# Patient Record
Sex: Female | Born: 1960 | Race: White | Hispanic: No | Marital: Married | Smoking: Former smoker
Health system: Southern US, Community
[De-identification: ages and names within clinical notes are randomized; demographics above are authoritative.]

## PROBLEM LIST (undated history)

## (undated) DIAGNOSIS — M810 Age-related osteoporosis without current pathological fracture: Secondary | ICD-10-CM

## (undated) DIAGNOSIS — F329 Major depressive disorder, single episode, unspecified: Secondary | ICD-10-CM

## (undated) DIAGNOSIS — M7071 Other bursitis of hip, right hip: Secondary | ICD-10-CM

## (undated) DIAGNOSIS — I10 Essential (primary) hypertension: Secondary | ICD-10-CM

## (undated) DIAGNOSIS — E785 Hyperlipidemia, unspecified: Secondary | ICD-10-CM

## (undated) DIAGNOSIS — R112 Nausea with vomiting, unspecified: Secondary | ICD-10-CM

## (undated) DIAGNOSIS — K219 Gastro-esophageal reflux disease without esophagitis: Secondary | ICD-10-CM

## (undated) DIAGNOSIS — R002 Palpitations: Secondary | ICD-10-CM

## (undated) DIAGNOSIS — E669 Obesity, unspecified: Secondary | ICD-10-CM

## (undated) DIAGNOSIS — H3530 Unspecified macular degeneration: Secondary | ICD-10-CM

## (undated) DIAGNOSIS — Z9889 Other specified postprocedural states: Secondary | ICD-10-CM

## (undated) DIAGNOSIS — F32A Depression, unspecified: Secondary | ICD-10-CM

## (undated) DIAGNOSIS — Z87898 Personal history of other specified conditions: Secondary | ICD-10-CM

## (undated) DIAGNOSIS — M7072 Other bursitis of hip, left hip: Secondary | ICD-10-CM

## (undated) DIAGNOSIS — R32 Unspecified urinary incontinence: Secondary | ICD-10-CM

## (undated) DIAGNOSIS — I498 Other specified cardiac arrhythmias: Secondary | ICD-10-CM

## (undated) DIAGNOSIS — G4730 Sleep apnea, unspecified: Secondary | ICD-10-CM

## (undated) DIAGNOSIS — L723 Sebaceous cyst: Secondary | ICD-10-CM

## (undated) DIAGNOSIS — F419 Anxiety disorder, unspecified: Secondary | ICD-10-CM

## (undated) DIAGNOSIS — K635 Polyp of colon: Secondary | ICD-10-CM

## (undated) DIAGNOSIS — R809 Proteinuria, unspecified: Secondary | ICD-10-CM

## (undated) HISTORY — DX: Proteinuria, unspecified: R80.9

## (undated) HISTORY — DX: Other bursitis of hip, right hip: M70.71

## (undated) HISTORY — DX: Obesity, unspecified: E66.9

## (undated) HISTORY — DX: Hyperlipidemia, unspecified: E78.5

## (undated) HISTORY — PX: COLONOSCOPY: SHX174

## (undated) HISTORY — DX: Polyp of colon: K63.5

## (undated) HISTORY — DX: Palpitations: R00.2

## (undated) HISTORY — DX: Unspecified urinary incontinence: R32

## (undated) HISTORY — DX: Age-related osteoporosis without current pathological fracture: M81.0

## (undated) HISTORY — DX: Other bursitis of hip, right hip: M70.72

## (undated) HISTORY — DX: Other specified cardiac arrhythmias: I49.8

## (undated) HISTORY — PX: UPPER GI ENDOSCOPY: SHX6162

## (undated) HISTORY — DX: Unspecified macular degeneration: H35.30

## (undated) HISTORY — PX: WRIST SURGERY: SHX841

---

## 2013-06-04 ENCOUNTER — Emergency Department (HOSPITAL_COMMUNITY)
Admission: EM | Admit: 2013-06-04 | Discharge: 2013-06-04 | Disposition: A | Discharge status: Home / Self Care | Payer: BC Managed Care – PPO | Attending: Emergency Medicine | Admitting: Emergency Medicine

## 2013-06-04 ENCOUNTER — Encounter (HOSPITAL_COMMUNITY): Payer: Self-pay | Admitting: Emergency Medicine

## 2013-06-04 ENCOUNTER — Emergency Department (HOSPITAL_COMMUNITY): Payer: BC Managed Care – PPO

## 2013-06-04 DIAGNOSIS — R109 Unspecified abdominal pain: Secondary | ICD-10-CM

## 2013-06-04 DIAGNOSIS — Z79899 Other long term (current) drug therapy: Secondary | ICD-10-CM | POA: Insufficient documentation

## 2013-06-04 DIAGNOSIS — R1013 Epigastric pain: Secondary | ICD-10-CM | POA: Insufficient documentation

## 2013-06-04 DIAGNOSIS — Z87891 Personal history of nicotine dependence: Secondary | ICD-10-CM | POA: Insufficient documentation

## 2013-06-04 LAB — CBC WITH DIFFERENTIAL/PLATELET
BASOS ABS: 0 10*3/uL (ref 0.0–0.1)
BASOS PCT: 0 % (ref 0–1)
EOS ABS: 0.1 10*3/uL (ref 0.0–0.7)
Eosinophils Relative: 1 % (ref 0–5)
HCT: 41.5 % (ref 36.0–46.0)
Hemoglobin: 14.3 g/dL (ref 12.0–15.0)
Lymphocytes Relative: 12 % (ref 12–46)
Lymphs Abs: 1.1 10*3/uL (ref 0.7–4.0)
MCH: 29.9 pg (ref 26.0–34.0)
MCHC: 34.5 g/dL (ref 30.0–36.0)
MCV: 86.8 fL (ref 78.0–100.0)
MONOS PCT: 5 % (ref 3–12)
Monocytes Absolute: 0.4 10*3/uL (ref 0.1–1.0)
NEUTROS ABS: 7.2 10*3/uL (ref 1.7–7.7)
Neutrophils Relative %: 81 % — ABNORMAL HIGH (ref 43–77)
PLATELETS: 264 10*3/uL (ref 150–400)
RBC: 4.78 MIL/uL (ref 3.87–5.11)
RDW: 13.3 % (ref 11.5–15.5)
WBC: 8.8 10*3/uL (ref 4.0–10.5)

## 2013-06-04 LAB — URINALYSIS, ROUTINE W REFLEX MICROSCOPIC
BILIRUBIN URINE: NEGATIVE
GLUCOSE, UA: NEGATIVE mg/dL
Hgb urine dipstick: NEGATIVE
Ketones, ur: 40 mg/dL — AB
NITRITE: NEGATIVE
PH: 7.5 (ref 5.0–8.0)
Protein, ur: NEGATIVE mg/dL
Specific Gravity, Urine: 1.012 (ref 1.005–1.030)
Urobilinogen, UA: 0.2 mg/dL (ref 0.0–1.0)

## 2013-06-04 LAB — COMPREHENSIVE METABOLIC PANEL
ALBUMIN: 4.4 g/dL (ref 3.5–5.2)
ALK PHOS: 77 U/L (ref 39–117)
ALT: 20 U/L (ref 0–35)
AST: 20 U/L (ref 0–37)
BUN: 17 mg/dL (ref 6–23)
CHLORIDE: 99 meq/L (ref 96–112)
CO2: 21 mEq/L (ref 19–32)
Calcium: 10 mg/dL (ref 8.4–10.5)
Creatinine, Ser: 0.67 mg/dL (ref 0.50–1.10)
GFR calc Af Amer: >90 mL/min (ref >90)
GFR calc non Af Amer: >90 mL/min (ref >90)
Glucose, Bld: 148 mg/dL — ABNORMAL HIGH (ref 70–99)
POTASSIUM: 3.9 meq/L (ref 3.7–5.3)
SODIUM: 137 meq/L (ref 137–147)
TOTAL PROTEIN: 7.6 g/dL (ref 6.0–8.3)
Total Bilirubin: 0.5 mg/dL (ref 0.3–1.2)

## 2013-06-04 LAB — LIPASE, BLOOD: LIPASE: 26 U/L (ref 11–59)

## 2013-06-04 LAB — URINE MICROSCOPIC-ADD ON

## 2013-06-04 MED ORDER — ONDANSETRON HCL 4 MG/2ML IJ SOLN
4.0000 mg | Freq: Once | INTRAMUSCULAR | Status: AC
  Administered 2013-06-04: 4 mg via INTRAVENOUS
  Filled 2013-06-04: qty 2

## 2013-06-04 MED ORDER — HYDROMORPHONE HCL PF 1 MG/ML IJ SOLN
1.0000 mg | Freq: Once | INTRAMUSCULAR | Status: AC
  Administered 2013-06-04: 1 mg via INTRAVENOUS
  Filled 2013-06-04: qty 1

## 2013-06-04 MED ORDER — SODIUM CHLORIDE 0.9 % IV SOLN
INTRAVENOUS | Status: DC
  Administered 2013-06-04: 13:00:00 via INTRAVENOUS

## 2013-06-04 MED ORDER — DICYCLOMINE HCL 20 MG PO TABS: 20.0000 mg | ORAL_TABLET | Freq: Two times a day (BID) | ORAL | Status: DC

## 2013-06-04 MED ORDER — IOHEXOL 300 MG/ML  SOLN
100.0000 mL | Freq: Once | INTRAMUSCULAR | Status: AC | PRN
  Administered 2013-06-04: 100 mL via INTRAVENOUS

## 2013-06-04 MED ORDER — IOHEXOL 300 MG/ML  SOLN
50.0000 mL | Freq: Once | INTRAMUSCULAR | Status: AC | PRN
  Administered 2013-06-04: 50 mL via ORAL

## 2013-06-04 MED ORDER — SODIUM CHLORIDE 0.9 % IV BOLUS (SEPSIS)
1000.0000 mL | Freq: Once | INTRAVENOUS | Status: AC
  Administered 2013-06-04: 1000 mL via INTRAVENOUS

## 2013-06-04 NOTE — ED Notes (Signed)
Pt states abdominal pain starting 1 hour ago.  Mid upper abdomen.  Pt states no fever/diarrhea or fever.

## 2013-06-04 NOTE — ED Notes (Signed)
Bed: WA06 Expected date:  Expected time:  Means of arrival:  Comments: 

## 2013-06-04 NOTE — ED Notes (Signed)
DRINKING ORAL CONTRAST 

## 2013-06-04 NOTE — ED Provider Notes (Signed)
CSN: [id number]     Arrival date & time 06/04/13  7829 History   First MD Initiated Contact with Patient 06/04/13 1012     Chief Complaint  Patient presents with  . Abdominal Pain     (Consider location/radiation/quality/duration/timing/severity/associated sxs/prior Treatment) Patient is a 53 y.o. female presenting with abdominal pain. The history is provided by the patient and the spouse.  Abdominal Pain  patient here complaining of acute onset of epigastric pain radiating to her back which began just prior to arrival. No fever or chills. No vomiting or diarrhea. Symptoms are persistent and nothing makes them better worse. No treatment used prior to arrival. No prior history of same. Denies any history of cholelithiasis. Denies any urinary symptoms. No dyspnea or anginal type chest pain.  History reviewed. No pertinent past medical history. Past Surgical History  Procedure Laterality Date  . Wrist surgery     History reviewed. No pertinent family history. History  Substance Use Topics  . Smoking status: Former Games developer  . Smokeless tobacco: Not on file  . Alcohol Use: No   OB History   Grav Para Term Preterm Abortions TAB SAB Ect Mult Living                 Review of Systems  Gastrointestinal: Positive for abdominal pain.  All other systems reviewed and are negative.      Allergies  Sulfa antibiotics  Home Medications   Current Outpatient Rx  Name  Route  Sig  Dispense  Refill  . escitalopram (LEXAPRO) 10 MG tablet   Oral   Take 10 mg by mouth daily.         . phentermine (ADIPEX-P) 37.5 MG tablet   Oral   Take 37.5 mg by mouth daily before breakfast.         . rosuvastatin (CRESTOR) 20 MG tablet   Oral   Take 20 mg by mouth daily.          BP 145/74  Pulse 104  Temp(Src) 97.5 F (36.4 C) (Oral)  Resp 18  SpO2 99% Physical Exam  Nursing note and vitals reviewed. Constitutional: She is oriented to person, place, and time. She appears  well-developed and well-nourished.  Non-toxic appearance. No distress.  HENT:  Head: Normocephalic and atraumatic.  Eyes: Conjunctivae, EOM and lids are normal. Pupils are equal, round, and reactive to light.  Neck: Normal range of motion. Neck supple. No tracheal deviation present. No mass present.  Cardiovascular: Normal rate, regular rhythm and normal heart sounds.  Exam reveals no gallop.   No murmur heard. Pulmonary/Chest: Effort normal and breath sounds normal. No stridor. No respiratory distress. She has no decreased breath sounds. She has no wheezes. She has no rhonchi. She has no rales.  Abdominal: Soft. Normal appearance and bowel sounds are normal. She exhibits no distension. There is tenderness in the epigastric area. There is guarding. There is no rigidity, no rebound and no CVA tenderness.    Musculoskeletal: Normal range of motion. She exhibits no edema and no tenderness.  Neurological: She is alert and oriented to person, place, and time. She has normal strength. No cranial nerve deficit or sensory deficit. GCS eye subscore is 4. GCS verbal subscore is 5. GCS motor subscore is 6.  Skin: Skin is warm and dry. No abrasion and no rash noted.  Psychiatric: She has a normal mood and affect. Her speech is normal and behavior is normal.    ED Course  Procedures (including critical  care time) Labs Review Labs Reviewed  CBC WITH DIFFERENTIAL - Abnormal; Notable for the following:    Neutrophils Relative % 81 (*)    All other components within normal limits  COMPREHENSIVE METABOLIC PANEL - Abnormal; Notable for the following:    Glucose, Bld 148 (*)    All other components within normal limits  URINALYSIS, ROUTINE W REFLEX MICROSCOPIC - Abnormal; Notable for the following:    Ketones, ur 40 (*)    Leukocytes, UA TRACE (*)    All other components within normal limits  LIPASE, BLOOD  URINE MICROSCOPIC-ADD ON   Imaging Review [place] Abdomen Complete  06/04/2013   CLINICAL DATA:   Pain.  EXAM: ULTRASOUND ABDOMEN COMPLETE  COMPARISON:  DG ABD ACUTE W/CHEST dated 06/04/2013; NM HEPATO W/GB/PHARM/QUAN MEASUR dated 04/21/2011  FINDINGS: Gallbladder:  No gallstones or wall thickening visualized. No sonographic Murphy sign noted.  Common bile duct:  Diameter: 4.8 mm.  Liver:  No focal lesion identified. Within normal limits in parenchymal echogenicity.  IVC:  No abnormality visualized.  Pancreas:  Pancreatic duct is minimally prominent at 2.5 mm. No obstructing abnormalities identified. No pancreatic mass noted.  Spleen:  Size and appearance within normal limits.  Right Kidney:  Length: 10.1 cm. Echogenicity within normal limits. No mass or hydronephrosis visualized.  Left Kidney:  Length: 11.2 cm. Echogenicity within normal limits. No mass or hydronephrosis visualized.  Abdominal aorta:  No aneurysm visualized.  Other findings:  None.  IMPRESSION: Mild prominence of the pancreatic duct. No evidence of pancreatic mass lesion noted. If clinical concern remains MRI abdomen/ MRCP can be obtained. Exam is otherwise unremarkable.   Electronically Signed   By: Maisie Fushomas  Register   On: 06/04/2013 12:15   Ct Abdomen Pelvis W Contrast  06/04/2013   CLINICAL DATA:  Mid and upper abdominal pain.  EXAM: CT ABDOMEN AND PELVIS WITH CONTRAST  TECHNIQUE: Multidetector CT imaging of the abdomen and pelvis was performed using the standard protocol following bolus administration of intravenous contrast.  CONTRAST:  50 mL OMNIPAQUE IOHEXOL 300 MG/ML SOLN, 100 mL OMNIPAQUE IOHEXOL 300 MG/ML SOLN  COMPARISON:  Chest in two views abdomen 06/04/2013.  FINDINGS: There is some dependent atelectasis in the lung bases. No pleural or pericardial effusion.  A few tiny hypo attenuating lesions in the liver cannot be definitively characterized but likely represent cysts. The liver is otherwise unremarkable. The spleen, adrenal glands, gallbladder, pancreas and kidneys appear normal. A small fat containing umbilical hernia is  identified. Uterus, adnexa and urinary bladder are unremarkable. Prominent stool and gas are seen in the ascending colon. The colon is otherwise unremarkable. The stomach, small bowel and appendix appear normal. There is no lymphadenopathy or fluid. No focal bony abnormality.  IMPRESSION: No acute finding or focal abnormality.  Prominent stool ascending colon is noted.   Electronically Signed   By: Drusilla Kannerhomas  Dalessio M.D.   On: 06/04/2013 15:02   Dg Abd Acute W/chest  06/04/2013   CLINICAL DATA:  Severe abdominal pain.  EXAM: ACUTE ABDOMEN SERIES (ABDOMEN 2 VIEW & CHEST 1 VIEW)  COMPARISON:  None.  FINDINGS: Single view of the chest demonstrates clear lungs and normal heart size. No pneumothorax or pleural effusion.  Two views of the abdomen show no free intraperitoneal air. There is a large volume of gas and stool in the ascending colon. No evidence of bowel obstruction is identified. No abnormal abdominal calcification or focal bony abnormality is identified.  IMPRESSION: No acute finding.  Large volume of  gas and stool ascending colon.   Electronically Signed   By: Drusilla Kanner M.D.   On: 06/04/2013 10:44    EKG Interpretation    Date/Time:  Tuesday June 04 2013 11:20:20 EST Ventricular Rate:  78 PR Interval:  146 QRS Duration: 76 QT Interval:  400 QTC Calculation: 456 R Axis:   81 Text Interpretation:  Sinus rhythm Ventricular premature complex Confirmed by Monish Haliburton  MD, Yaritza Leist ([id number]) on 06/04/2013 3:27:15 PM            MDM   Final diagnoses:  None    patient given pain medication as well as anti-medics here and feels better. Work appears been reassuring. Does show up on constipation. Patient's abdomen examined,and at  discharge and remains nonsurgical.     Toy Baker, MD 06/04/13 [phone]

## 2013-06-04 NOTE — Discharge Instructions (Signed)
Abdominal Pain, Adult °Many things can cause abdominal pain. Usually, abdominal pain is not caused by a disease and will improve without treatment. It can often be observed and treated at home. Your health care provider will do a physical exam and possibly order blood tests and X-rays to help determine the seriousness of your pain. However, in many cases, more time must pass before a clear cause of the pain can be found. Before that point, your health care provider may not know if you need more testing or further treatment. °HOME CARE INSTRUCTIONS  °Monitor your abdominal pain for any changes. The following actions may help to alleviate any discomfort you are experiencing: °· Only take over-the-counter or prescription medicines as directed by your health care provider. °· Do not take laxatives unless directed to do so by your health care provider. °· Try a clear liquid diet (broth, tea, or water) as directed by your health care provider. Slowly move to a bland diet as tolerated. °SEEK MEDICAL CARE IF: °· You have unexplained abdominal pain. °· You have abdominal pain associated with nausea or diarrhea. °· You have pain when you urinate or have a bowel movement. °· You experience abdominal pain that wakes you in the night. °· You have abdominal pain that is worsened or improved by eating food. °· You have abdominal pain that is worsened with eating fatty foods. °SEEK IMMEDIATE MEDICAL CARE IF:  °· Your pain does not go away within 2 hours. °· You have a fever. °· You keep throwing up (vomiting). °· Your pain is felt only in portions of the abdomen, such as the right side or the left lower portion of the abdomen. °· You pass bloody or black tarry stools. °MAKE SURE YOU: °· Understand these instructions.   °· Will watch your condition.   °· Will get help right away if you are not doing well or get worse.   °Document Released: 01/05/2005 Document Revised: 01/16/2013 Document Reviewed: 12/05/2012 °[employer]® Patient  Information ©2014 [employer]. ° °

## 2013-11-26 ENCOUNTER — Other Ambulatory Visit: Payer: Self-pay

## 2013-11-26 DIAGNOSIS — Z1231 Encounter for screening mammogram for malignant neoplasm of breast: Secondary | ICD-10-CM

## 2013-12-05 ENCOUNTER — Encounter (INDEPENDENT_AMBULATORY_CARE_PROVIDER_SITE_OTHER): Payer: Self-pay

## 2013-12-05 ENCOUNTER — Ambulatory Visit
Admission: RE | Admit: 2013-12-05 | Discharge: 2013-12-05 | Disposition: A | Discharge status: Home / Self Care | Payer: BC Managed Care – PPO | Source: Ambulatory Visit

## 2013-12-05 DIAGNOSIS — Z1231 Encounter for screening mammogram for malignant neoplasm of breast: Secondary | ICD-10-CM

## 2014-11-04 ENCOUNTER — Other Ambulatory Visit: Payer: Self-pay

## 2014-11-04 DIAGNOSIS — Z1231 Encounter for screening mammogram for malignant neoplasm of breast: Secondary | ICD-10-CM

## 2014-12-10 ENCOUNTER — Ambulatory Visit
Admission: RE | Admit: 2014-12-10 | Discharge: 2014-12-10 | Disposition: A | Discharge status: Home / Self Care | Payer: 59 | Source: Ambulatory Visit

## 2014-12-10 DIAGNOSIS — Z1231 Encounter for screening mammogram for malignant neoplasm of breast: Secondary | ICD-10-CM

## 2015-07-13 DIAGNOSIS — M7062 Trochanteric bursitis, left hip: Secondary | ICD-10-CM | POA: Diagnosis not present

## 2015-07-15 DIAGNOSIS — M7062 Trochanteric bursitis, left hip: Secondary | ICD-10-CM | POA: Diagnosis not present

## 2015-07-17 DIAGNOSIS — M7062 Trochanteric bursitis, left hip: Secondary | ICD-10-CM | POA: Diagnosis not present

## 2015-07-20 DIAGNOSIS — M7062 Trochanteric bursitis, left hip: Secondary | ICD-10-CM | POA: Diagnosis not present

## 2015-07-22 DIAGNOSIS — M7062 Trochanteric bursitis, left hip: Secondary | ICD-10-CM | POA: Diagnosis not present

## 2015-08-27 DIAGNOSIS — M76891 Other specified enthesopathies of right lower limb, excluding foot: Secondary | ICD-10-CM | POA: Diagnosis not present

## 2015-08-27 DIAGNOSIS — M76892 Other specified enthesopathies of left lower limb, excluding foot: Secondary | ICD-10-CM | POA: Diagnosis not present

## 2015-08-28 DIAGNOSIS — E785 Hyperlipidemia, unspecified: Secondary | ICD-10-CM | POA: Diagnosis not present

## 2015-08-28 DIAGNOSIS — M25551 Pain in right hip: Secondary | ICD-10-CM | POA: Diagnosis not present

## 2015-08-28 DIAGNOSIS — M25552 Pain in left hip: Secondary | ICD-10-CM | POA: Diagnosis not present

## 2015-08-28 DIAGNOSIS — R609 Edema, unspecified: Secondary | ICD-10-CM | POA: Diagnosis not present

## 2015-09-16 DIAGNOSIS — M25552 Pain in left hip: Secondary | ICD-10-CM | POA: Diagnosis not present

## 2015-09-16 DIAGNOSIS — M25551 Pain in right hip: Secondary | ICD-10-CM | POA: Diagnosis not present

## 2015-09-17 DIAGNOSIS — Z85828 Personal history of other malignant neoplasm of skin: Secondary | ICD-10-CM | POA: Diagnosis not present

## 2015-09-17 DIAGNOSIS — L438 Other lichen planus: Secondary | ICD-10-CM | POA: Diagnosis not present

## 2015-09-23 DIAGNOSIS — M76892 Other specified enthesopathies of left lower limb, excluding foot: Secondary | ICD-10-CM | POA: Diagnosis not present

## 2015-09-23 DIAGNOSIS — M76891 Other specified enthesopathies of right lower limb, excluding foot: Secondary | ICD-10-CM | POA: Diagnosis not present

## 2015-10-21 DIAGNOSIS — R42 Dizziness and giddiness: Secondary | ICD-10-CM | POA: Diagnosis not present

## 2015-10-21 DIAGNOSIS — R51 Headache: Secondary | ICD-10-CM | POA: Diagnosis not present

## 2015-11-10 DIAGNOSIS — E785 Hyperlipidemia, unspecified: Secondary | ICD-10-CM | POA: Diagnosis not present

## 2015-11-10 DIAGNOSIS — R51 Headache: Secondary | ICD-10-CM | POA: Diagnosis not present

## 2016-01-06 DIAGNOSIS — M7062 Trochanteric bursitis, left hip: Secondary | ICD-10-CM | POA: Diagnosis not present

## 2016-01-06 DIAGNOSIS — M25551 Pain in right hip: Secondary | ICD-10-CM | POA: Diagnosis not present

## 2016-01-06 DIAGNOSIS — M25552 Pain in left hip: Secondary | ICD-10-CM | POA: Diagnosis not present

## 2016-01-06 DIAGNOSIS — M7061 Trochanteric bursitis, right hip: Secondary | ICD-10-CM | POA: Diagnosis not present

## 2016-02-03 ENCOUNTER — Other Ambulatory Visit: Payer: Self-pay | Admitting: Internal Medicine

## 2016-02-03 DIAGNOSIS — Z1231 Encounter for screening mammogram for malignant neoplasm of breast: Secondary | ICD-10-CM

## 2016-03-08 ENCOUNTER — Ambulatory Visit
Admission: RE | Admit: 2016-03-08 | Discharge: 2016-03-08 | Disposition: A | Discharge status: Home / Self Care | Payer: BLUE CROSS/BLUE SHIELD | Source: Ambulatory Visit | Attending: Internal Medicine | Admitting: Internal Medicine

## 2016-03-08 DIAGNOSIS — Z1231 Encounter for screening mammogram for malignant neoplasm of breast: Secondary | ICD-10-CM

## 2016-03-17 DIAGNOSIS — L820 Inflamed seborrheic keratosis: Secondary | ICD-10-CM | POA: Diagnosis not present

## 2016-03-19 DIAGNOSIS — Z23 Encounter for immunization: Secondary | ICD-10-CM | POA: Diagnosis not present

## 2016-03-21 DIAGNOSIS — K219 Gastro-esophageal reflux disease without esophagitis: Secondary | ICD-10-CM | POA: Diagnosis not present

## 2016-03-21 DIAGNOSIS — E784 Other hyperlipidemia: Secondary | ICD-10-CM | POA: Diagnosis not present

## 2016-03-21 DIAGNOSIS — F329 Major depressive disorder, single episode, unspecified: Secondary | ICD-10-CM | POA: Diagnosis not present

## 2016-03-21 DIAGNOSIS — M7070 Other bursitis of hip, unspecified hip: Secondary | ICD-10-CM | POA: Diagnosis not present

## 2016-04-07 DIAGNOSIS — M7061 Trochanteric bursitis, right hip: Secondary | ICD-10-CM | POA: Diagnosis not present

## 2016-04-07 DIAGNOSIS — M25551 Pain in right hip: Secondary | ICD-10-CM | POA: Diagnosis not present

## 2016-04-07 DIAGNOSIS — M25552 Pain in left hip: Secondary | ICD-10-CM | POA: Diagnosis not present

## 2016-04-07 DIAGNOSIS — M7062 Trochanteric bursitis, left hip: Secondary | ICD-10-CM | POA: Diagnosis not present

## 2016-04-15 DIAGNOSIS — D2262 Melanocytic nevi of left upper limb, including shoulder: Secondary | ICD-10-CM | POA: Diagnosis not present

## 2016-04-15 DIAGNOSIS — L821 Other seborrheic keratosis: Secondary | ICD-10-CM | POA: Diagnosis not present

## 2016-04-15 DIAGNOSIS — D2261 Melanocytic nevi of right upper limb, including shoulder: Secondary | ICD-10-CM | POA: Diagnosis not present

## 2016-04-15 DIAGNOSIS — Z85828 Personal history of other malignant neoplasm of skin: Secondary | ICD-10-CM | POA: Diagnosis not present

## 2016-05-04 DIAGNOSIS — Z6829 Body mass index (BMI) 29.0-29.9, adult: Secondary | ICD-10-CM | POA: Diagnosis not present

## 2016-05-04 DIAGNOSIS — I10 Essential (primary) hypertension: Secondary | ICD-10-CM | POA: Diagnosis not present

## 2016-05-04 DIAGNOSIS — E784 Other hyperlipidemia: Secondary | ICD-10-CM | POA: Diagnosis not present

## 2016-06-09 DIAGNOSIS — I788 Other diseases of capillaries: Secondary | ICD-10-CM | POA: Diagnosis not present

## 2016-07-10 DIAGNOSIS — J209 Acute bronchitis, unspecified: Secondary | ICD-10-CM | POA: Diagnosis not present

## 2016-07-10 DIAGNOSIS — R05 Cough: Secondary | ICD-10-CM | POA: Diagnosis not present

## 2016-08-11 DIAGNOSIS — M7061 Trochanteric bursitis, right hip: Secondary | ICD-10-CM | POA: Diagnosis not present

## 2016-08-11 DIAGNOSIS — M7062 Trochanteric bursitis, left hip: Secondary | ICD-10-CM | POA: Diagnosis not present

## 2016-08-11 DIAGNOSIS — M25551 Pain in right hip: Secondary | ICD-10-CM | POA: Diagnosis not present

## 2016-08-11 DIAGNOSIS — M25552 Pain in left hip: Secondary | ICD-10-CM | POA: Diagnosis not present

## 2016-09-10 DIAGNOSIS — J209 Acute bronchitis, unspecified: Secondary | ICD-10-CM | POA: Diagnosis not present

## 2016-09-20 DIAGNOSIS — M76892 Other specified enthesopathies of left lower limb, excluding foot: Secondary | ICD-10-CM | POA: Diagnosis not present

## 2016-09-20 DIAGNOSIS — M25552 Pain in left hip: Secondary | ICD-10-CM | POA: Diagnosis not present

## 2016-09-27 DIAGNOSIS — G4733 Obstructive sleep apnea (adult) (pediatric): Secondary | ICD-10-CM | POA: Diagnosis not present

## 2016-09-27 DIAGNOSIS — R05 Cough: Secondary | ICD-10-CM | POA: Diagnosis not present

## 2016-09-27 DIAGNOSIS — I10 Essential (primary) hypertension: Secondary | ICD-10-CM | POA: Diagnosis not present

## 2016-09-27 DIAGNOSIS — K219 Gastro-esophageal reflux disease without esophagitis: Secondary | ICD-10-CM | POA: Diagnosis not present

## 2017-01-12 DIAGNOSIS — Z23 Encounter for immunization: Secondary | ICD-10-CM | POA: Diagnosis not present

## 2017-03-01 DIAGNOSIS — M25552 Pain in left hip: Secondary | ICD-10-CM | POA: Diagnosis not present

## 2017-03-01 DIAGNOSIS — M76892 Other specified enthesopathies of left lower limb, excluding foot: Secondary | ICD-10-CM | POA: Diagnosis not present

## 2017-03-01 DIAGNOSIS — M7062 Trochanteric bursitis, left hip: Secondary | ICD-10-CM | POA: Diagnosis not present

## 2017-03-07 ENCOUNTER — Other Ambulatory Visit: Payer: Self-pay | Admitting: Internal Medicine

## 2017-03-07 DIAGNOSIS — Z139 Encounter for screening, unspecified: Secondary | ICD-10-CM

## 2017-04-05 ENCOUNTER — Ambulatory Visit: Payer: BLUE CROSS/BLUE SHIELD

## 2017-04-21 DIAGNOSIS — L723 Sebaceous cyst: Secondary | ICD-10-CM | POA: Diagnosis not present

## 2017-04-21 DIAGNOSIS — Z85828 Personal history of other malignant neoplasm of skin: Secondary | ICD-10-CM | POA: Diagnosis not present

## 2017-04-21 DIAGNOSIS — L821 Other seborrheic keratosis: Secondary | ICD-10-CM | POA: Diagnosis not present

## 2017-04-21 DIAGNOSIS — Z6829 Body mass index (BMI) 29.0-29.9, adult: Secondary | ICD-10-CM | POA: Diagnosis not present

## 2017-04-21 DIAGNOSIS — L820 Inflamed seborrheic keratosis: Secondary | ICD-10-CM | POA: Diagnosis not present

## 2017-04-21 DIAGNOSIS — H6121 Impacted cerumen, right ear: Secondary | ICD-10-CM | POA: Diagnosis not present

## 2017-04-21 DIAGNOSIS — H9191 Unspecified hearing loss, right ear: Secondary | ICD-10-CM | POA: Diagnosis not present

## 2017-05-03 ENCOUNTER — Ambulatory Visit
Admission: RE | Admit: 2017-05-03 | Discharge: 2017-05-03 | Disposition: A | Discharge status: Home / Self Care | Payer: BLUE CROSS/BLUE SHIELD | Source: Ambulatory Visit | Attending: Internal Medicine | Admitting: Internal Medicine

## 2017-05-03 DIAGNOSIS — M25552 Pain in left hip: Secondary | ICD-10-CM | POA: Diagnosis not present

## 2017-05-03 DIAGNOSIS — Z1231 Encounter for screening mammogram for malignant neoplasm of breast: Secondary | ICD-10-CM | POA: Diagnosis not present

## 2017-05-03 DIAGNOSIS — M7062 Trochanteric bursitis, left hip: Secondary | ICD-10-CM | POA: Diagnosis not present

## 2017-05-03 DIAGNOSIS — Z139 Encounter for screening, unspecified: Secondary | ICD-10-CM

## 2017-05-17 DIAGNOSIS — M25552 Pain in left hip: Secondary | ICD-10-CM | POA: Diagnosis not present

## 2017-05-24 DIAGNOSIS — M25552 Pain in left hip: Secondary | ICD-10-CM | POA: Diagnosis not present

## 2017-05-24 DIAGNOSIS — M7602 Gluteal tendinitis, left hip: Secondary | ICD-10-CM | POA: Diagnosis not present

## 2017-06-07 DIAGNOSIS — I10 Essential (primary) hypertension: Secondary | ICD-10-CM | POA: Diagnosis not present

## 2017-06-07 DIAGNOSIS — D2261 Melanocytic nevi of right upper limb, including shoulder: Secondary | ICD-10-CM | POA: Diagnosis not present

## 2017-06-07 DIAGNOSIS — L821 Other seborrheic keratosis: Secondary | ICD-10-CM | POA: Diagnosis not present

## 2017-06-07 DIAGNOSIS — L820 Inflamed seborrheic keratosis: Secondary | ICD-10-CM | POA: Diagnosis not present

## 2017-06-07 DIAGNOSIS — D2262 Melanocytic nevi of left upper limb, including shoulder: Secondary | ICD-10-CM | POA: Diagnosis not present

## 2017-06-07 DIAGNOSIS — Z0000 Encounter for general adult medical examination without abnormal findings: Secondary | ICD-10-CM | POA: Diagnosis not present

## 2017-06-07 DIAGNOSIS — Z85828 Personal history of other malignant neoplasm of skin: Secondary | ICD-10-CM | POA: Diagnosis not present

## 2017-06-07 DIAGNOSIS — R82998 Other abnormal findings in urine: Secondary | ICD-10-CM | POA: Diagnosis not present

## 2017-06-07 DIAGNOSIS — B078 Other viral warts: Secondary | ICD-10-CM | POA: Diagnosis not present

## 2017-06-13 ENCOUNTER — Encounter (HOSPITAL_COMMUNITY): Payer: Self-pay

## 2017-06-13 NOTE — Patient Instructions (Addendum)
Your procedure is scheduled on: Monday, June 19, 2017   Surgery Time:  9:00AM-10:10AM   [facility]    Report to admitting at 6:30 AM   Call this number if you have problems the morning of surgery [phone]   Do not eat food or drink liquids :After Midnight.   Do NOT smoke after Midnight   Take these medicines the morning of surgery with A SIP OF WATER: Escitalopram, Famotidine                               You may not have any metal on your body including hair pins, jewelry, and body piercings             Do not wear make-up, lotions, powders, perfumes/cologne, or deodorant             Do not wear nail polish.  Do not shave  48 hours prior to surgery.                Do not bring valuables to the hospital. [facility] IS NOT             RESPONSIBLE   FOR VALUABLES.   Contacts, dentures or bridgework may not be worn into surgery.   Leave suitcase in the car. After surgery it may be brought to your room.   Special Instructions: Bring a copy of your healthcare power of attorney and living will documents         the day of surgery if you haven't scanned them in before.   BRING CPAP MACHINE AND EQUIPMENT FOR SURGERY              Please read over the following fact sheets you were given:   [facility] - Preparing for [facility] Before surgery, you can play an important role.  Because skin is not sterile, your skin needs to be as free of germs as possible.  You can reduce the number of germs on your skin by washing with CHG (chlorahexidine gluconate) soap before surgery.  CHG is an antiseptic cleaner which kills germs and bonds with the skin to continue killing germs even after washing. Please DO NOT use if you have an allergy to CHG or antibacterial soaps.  If your skin becomes reddened/irritated stop using the CHG and inform your nurse when you arrive at Short Stay. Do not shave (including legs and underarms) for at least 48 hours prior to the  first CHG shower.  You may shave your face/neck.  Please follow these instructions carefully:  1.  Shower with CHG Soap the night before surgery and the  morning of surgery.  2.  If you choose to wash your hair, wash your hair first as usual with your normal  shampoo.  3.  After you shampoo, rinse your hair and body thoroughly to remove the shampoo.                             4.  Use CHG as you would any other liquid soap.  You can apply chg directly to the skin and wash.  Gently with a scrungie or clean washcloth.  5.  Apply the CHG Soap to your body ONLY FROM THE NECK DOWN.   Do   not use on face/ open  Wound or open sores. Avoid contact with eyes, ears mouth and   genitals (private parts).                       Wash face,  Genitals (private parts) with your normal soap.             6.  Wash thoroughly, paying special attention to the area where your    surgery  will be performed.  7.  Thoroughly rinse your body with warm water from the neck down.  8.  DO NOT shower/wash with your normal soap after using and rinsing off the CHG Soap.                9.  Pat yourself dry with a clean towel.            10.  Wear clean pajamas.            11.  Place clean sheets on your bed the night of your first shower and do not  sleep with pets. Day of Surgery : Do not apply any lotions/deodorants the morning of surgery.  Please wear clean clothes to the hospital/surgery center.  FAILURE TO FOLLOW THESE INSTRUCTIONS MAY RESULT IN THE CANCELLATION OF YOUR SURGERY  PATIENT SIGNATURE_________________________________  NURSE SIGNATURE__________________________________  ________________________________________________________________________   Tracy Atkins  An incentive spirometer is a tool that can help keep your lungs clear and active. This tool measures how well you are filling your lungs with each breath. Taking long deep breaths may help reverse or decrease the chance  of developing breathing (pulmonary) problems (especially infection) following:  A long period of time when you are unable to move or be active. BEFORE THE PROCEDURE   If the spirometer includes an indicator to show your best effort, your nurse or respiratory therapist will set it to a desired goal.  If possible, sit up straight or lean slightly forward. Try not to slouch.  Hold the incentive spirometer in an upright position. INSTRUCTIONS FOR USE  1. Sit on the edge of your bed if possible, or sit up as far as you can in bed or on a chair. 2. Hold the incentive spirometer in an upright position. 3. Breathe out normally. 4. Place the mouthpiece in your mouth and seal your lips tightly around it. 5. Breathe in slowly and as deeply as possible, raising the piston or the ball toward the top of the column. 6. Hold your breath for 3-5 seconds or for as long as possible. Allow the piston or ball to fall to the bottom of the column. 7. Remove the mouthpiece from your mouth and breathe out normally. 8. Rest for a few seconds and repeat Steps 1 through 7 at least 10 times every 1-2 hours when you are awake. Take your time and take a few normal breaths between deep breaths. 9. The spirometer may include an indicator to show your best effort. Use the indicator as a goal to work toward during each repetition. 10. After each set of 10 deep breaths, practice coughing to be sure your lungs are clear. If you have an incision (the cut made at the time of surgery), support your incision when coughing by placing a pillow or rolled up towels firmly against it. Once you are able to get out of bed, walk around indoors and cough well. You may stop using the incentive spirometer when instructed by your caregiver.  RISKS AND COMPLICATIONS  Take your time  so you do not get dizzy or light-headed.  If you are in pain, you may need to take or ask for pain medication before doing incentive spirometry. It is harder to  take a deep breath if you are having pain. AFTER USE  Rest and breathe slowly and easily.  It can be helpful to keep track of a log of your progress. Your caregiver can provide you with a simple table to help with this. If you are using the spirometer at home, follow these instructions: [place] IF:   You are having difficultly using the spirometer.  You have trouble using the spirometer as often as instructed.  Your pain medication is not giving enough relief while using the spirometer.  You develop fever of 100.5 F (38.1 C) or higher. SEEK IMMEDIATE MEDICAL CARE IF:   You cough up bloody sputum that had not been present before.  You develop fever of 102 F (38.9 C) or greater.  You develop worsening pain at or near the incision site. MAKE SURE YOU:   Understand these instructions.  Will watch your condition.  Will get help right away if you are not doing well or get worse. Document Released: 08/08/2006 Document Revised: 06/20/2011 Document Reviewed: 10/09/2006 Exit[employer] Patient Information 2014 [employer], [place].   ________________________________________________________________________  WHAT IS A BLOOD TRANSFUSION? Blood Transfusion Information  A transfusion is the replacement of blood or some of its parts. Blood is made up of multiple cells which provide different functions.  Red blood cells carry oxygen and are used for blood loss replacement.  White blood cells fight against infection.  Platelets control bleeding.  Plasma helps clot blood.  Other blood products are available for specialized needs, such as hemophilia or other clotting disorders. BEFORE THE TRANSFUSION  Who gives blood for transfusions?   Healthy volunteers who are fully evaluated to make sure their blood is safe. This is blood bank blood. Transfusion therapy is the safest it has ever been in the practice of medicine. Before blood is taken from a donor, a complete history is taken to  make sure that person has no history of diseases nor engages in risky social behavior (examples are intravenous drug use or sexual activity with multiple partners). The donor's travel history is screened to minimize risk of transmitting infections, such as malaria. The donated blood is tested for signs of infectious diseases, such as HIV and hepatitis. The blood is then tested to be sure it is compatible with you in order to minimize the chance of a transfusion reaction. If you or a relative donates blood, this is often done in anticipation of surgery and is not appropriate for emergency situations. It takes many days to process the donated blood. RISKS AND COMPLICATIONS Although transfusion therapy is very safe and saves many lives, the main dangers of transfusion include:   Getting an infectious disease.  Developing a transfusion reaction. This is an allergic reaction to something in the blood you were given. Every precaution is taken to prevent this. The decision to have a blood transfusion has been considered carefully by your caregiver before blood is given. Blood is not given unless the benefits outweigh the risks. AFTER THE TRANSFUSION  Right after receiving a blood transfusion, you will usually feel much better and more energetic. This is especially true if your red blood cells have gotten low (anemic). The transfusion raises the level of the red blood cells which carry oxygen, and this usually causes an energy increase.  The  nurse administering the transfusion will monitor you carefully for complications. HOME CARE INSTRUCTIONS  No special instructions are needed after a transfusion. You may find your energy is better. Speak with your caregiver about any limitations on activity for underlying diseases you may have. SEEK MEDICAL CARE IF:   Your condition is not improving after your transfusion.  You develop redness or irritation at the intravenous (IV) site. SEEK IMMEDIATE MEDICAL CARE  IF:  Any of the following symptoms occur over the next 12 hours:  Shaking chills.  You have a temperature by mouth above 102 F (38.9 C), not controlled by medicine.  Chest, back, or muscle pain.  People around you feel you are not acting correctly or are confused.  Shortness of breath or difficulty breathing.  Dizziness and fainting.  You get a rash or develop hives.  You have a decrease in urine output.  Your urine turns a dark color or changes to pink, red, or brown. Any of the following symptoms occur over the next 10 days:  You have a temperature by mouth above 102 F (38.9 C), not controlled by medicine.  Shortness of breath.  Weakness after normal activity.  The white part of the eye turns yellow (jaundice).  You have a decrease in the amount of urine or are urinating less often.  Your urine turns a dark color or changes to pink, red, or brown. Document Released: 03/25/2000 Document Revised: 06/20/2011 Document Reviewed: 11/12/2007 [facility] Patient Information 2014 [place], [place].  _______________________________________________________________________

## 2017-06-14 ENCOUNTER — Encounter (HOSPITAL_COMMUNITY): Payer: Self-pay

## 2017-06-14 ENCOUNTER — Encounter (HOSPITAL_COMMUNITY)
Admission: RE | Admit: 2017-06-14 | Discharge: 2017-06-14 | Disposition: A | Discharge status: Home / Self Care | Payer: BLUE CROSS/BLUE SHIELD | Source: Ambulatory Visit | Attending: Orthopedic Surgery | Admitting: Orthopedic Surgery

## 2017-06-14 ENCOUNTER — Other Ambulatory Visit: Payer: Self-pay

## 2017-06-14 DIAGNOSIS — Z0000 Encounter for general adult medical examination without abnormal findings: Secondary | ICD-10-CM | POA: Diagnosis not present

## 2017-06-14 DIAGNOSIS — Z6830 Body mass index (BMI) 30.0-30.9, adult: Secondary | ICD-10-CM | POA: Diagnosis not present

## 2017-06-14 DIAGNOSIS — L723 Sebaceous cyst: Secondary | ICD-10-CM

## 2017-06-14 DIAGNOSIS — Z01810 Encounter for preprocedural cardiovascular examination: Secondary | ICD-10-CM | POA: Insufficient documentation

## 2017-06-14 DIAGNOSIS — Z1389 Encounter for screening for other disorder: Secondary | ICD-10-CM | POA: Diagnosis not present

## 2017-06-14 DIAGNOSIS — G4733 Obstructive sleep apnea (adult) (pediatric): Secondary | ICD-10-CM | POA: Diagnosis not present

## 2017-06-14 DIAGNOSIS — H9191 Unspecified hearing loss, right ear: Secondary | ICD-10-CM | POA: Diagnosis not present

## 2017-06-14 DIAGNOSIS — I10 Essential (primary) hypertension: Secondary | ICD-10-CM | POA: Diagnosis not present

## 2017-06-14 DIAGNOSIS — Z01812 Encounter for preprocedural laboratory examination: Secondary | ICD-10-CM | POA: Insufficient documentation

## 2017-06-14 DIAGNOSIS — R05 Cough: Secondary | ICD-10-CM | POA: Diagnosis not present

## 2017-06-14 HISTORY — DX: Sleep apnea, unspecified: G47.30

## 2017-06-14 HISTORY — DX: Nausea with vomiting, unspecified: R11.2

## 2017-06-14 HISTORY — DX: Sebaceous cyst: L72.3

## 2017-06-14 HISTORY — DX: Other specified postprocedural states: Z98.890

## 2017-06-14 HISTORY — DX: Anxiety disorder, unspecified: F41.9

## 2017-06-14 HISTORY — DX: Depression, unspecified: F32.A

## 2017-06-14 HISTORY — DX: Personal history of other specified conditions: Z87.898

## 2017-06-14 HISTORY — DX: Gastro-esophageal reflux disease without esophagitis: K21.9

## 2017-06-14 HISTORY — DX: Major depressive disorder, single episode, unspecified: F32.9

## 2017-06-14 HISTORY — DX: Essential (primary) hypertension: I10

## 2017-06-14 NOTE — Pre-Procedure Instructions (Signed)
CBC and CMP results 06/07/2017 are on chart.

## 2017-06-15 ENCOUNTER — Encounter (HOSPITAL_COMMUNITY): Payer: Self-pay

## 2017-06-15 LAB — ABO/RH: ABO/RH(D): A POS

## 2017-06-15 NOTE — Pre-Procedure Instructions (Signed)
LMOM for Tracy Atkins to make her aware that patient stated she had a draining sebaceous cyst on her neck.  She has been seen by her dermatologist 06/14/2017 who is following her for this issue.

## 2017-06-16 DIAGNOSIS — Z1212 Encounter for screening for malignant neoplasm of rectum: Secondary | ICD-10-CM | POA: Diagnosis not present

## 2017-06-18 NOTE — H&P (Signed)
Tracy Atkins is an 57 y.o. female.    Chief Complaint: Left hip chronic bursitis and gluteal medius partial tear, tendonpathy  Procedure:  Left hip open bursectomy with possible gluteal tendon repair  HPI: Pt is a 58 y.o. female complaining of left hip pain for 6+ years. Pain had continually increased since the beginning.  MRI reveal findings for surgery.   Pt has tried various conservative treatments which have failed to alleviate their symptoms, including NSAIDs, cortisone injections and activity modifications. Various options are discussed with the patient. Risks, benefits and expectations were discussed with the patient. Patient understand the risks, benefits and expectations and wishes to proceed with surgery.    PCP: Rodrigo Ran, MD  D/C Plans:       Home   Post-op Meds:       No Rx given   Tranexamic Acid:      To be given - IV   Decadron:      is to be given  FYI:      ASA  Norco  CPAP  DME:   Rx given for - RW   PT:   No PT   PMH: Past Medical History:  Diagnosis Date  . Anxiety   . Depression   . GERD (gastroesophageal reflux disease)   . History of dizziness   . Hypertension   . PONV (postoperative nausea and vomiting)   . Sebaceous cyst 06/14/2017   neck, currently draining  . Sleep apnea     PSH: Past Surgical History:  Procedure Laterality Date  . COLONOSCOPY    . UPPER GI ENDOSCOPY    . WRIST SURGERY      Social History:  reports that she has quit smoking. she has never used smokeless tobacco. She reports that she does not drink alcohol or use drugs.  Allergies:  Allergies  Allergen Reactions  . Sulfa Antibiotics Anaphylaxis    Medications: No current facility-administered medications for this encounter.    Current Outpatient Medications  Medication Sig Dispense Refill  . acetaminophen (TYLENOL) 500 MG tablet Take 1,500 mg by mouth daily as needed for moderate pain or headache.    . Biotin 2500 MCG CAPS Take 2,500 mcg by mouth  daily.    . Coenzyme Q10-Fish Oil-Vit E (CO-Q 10 OMEGA-3 FISH OIL PO) Take 1 capsule by mouth daily.    Marland Kitchen dicyclomine (BENTYL) 20 MG tablet Take 1 tablet (20 mg total) by mouth 2 (two) times daily. (Patient taking differently: Take 20 mg by mouth daily as needed for spasms. ) 20 tablet 0  . diphenhydrAMINE (BENADRYL) 25 MG tablet Take 25 mg by mouth daily as needed for allergies.    Marland Kitchen escitalopram (LEXAPRO) 20 MG tablet Take 20 mg by mouth daily.    . famotidine (PEPCID) 20 MG tablet Take 20 mg by mouth 2 (two) times daily.    Marland Kitchen ibuprofen (ADVIL,MOTRIN) 200 MG tablet Take 400 mg by mouth 2 (two) times daily as needed for moderate pain.    Marland Kitchen lisinopril (PRINIVIL,ZESTRIL) 5 MG tablet Take 5 mg by mouth daily.    . Magnesium 250 MG TABS Take 250 mg by mouth daily.    . Multiple Vitamin (MULTIVITAMIN WITH MINERALS) TABS tablet Take 1 tablet by mouth daily.    . Probiotic CAPS Take 1 capsule by mouth at bedtime.    . rosuvastatin (CRESTOR) 20 MG tablet Take 20 mg by mouth daily.    . traZODone (DESYREL) 100 MG tablet Take  100 mg by mouth at bedtime.        Review of Systems  Constitutional: Negative.   HENT: Negative.   Eyes: Negative.   Respiratory: Negative.   Cardiovascular: Negative.   Gastrointestinal: Positive for heartburn.  Genitourinary: Negative.   Musculoskeletal: Positive for joint pain.  Skin: Negative.   Neurological: Negative.   Endo/Heme/Allergies: Negative.        Physical Exam  Constitutional: She is oriented to person, place, and time. She appears well-developed.  HENT:  Head: Normocephalic.  Eyes: Pupils are equal, round, and reactive to light.  Neck: Neck supple. No JVD present. No tracheal deviation present. No thyromegaly present.  Cardiovascular: Normal rate, regular rhythm and intact distal pulses.  Respiratory: Effort normal and breath sounds normal. No respiratory distress. She has no wheezes.  GI: Soft. There is no tenderness. There is no guarding.    Musculoskeletal:       Left hip: She exhibits decreased range of motion, decreased strength, tenderness, bony tenderness and swelling. She exhibits no deformity and no laceration.  Lymphadenopathy:    She has no cervical adenopathy.  Neurological: She is alert and oriented to person, place, and time.  Skin: Skin is warm and dry.  Psychiatric: She has a normal mood and affect.       Assessment/Plan Assessment:    Left hip chronic bursitis and gluteal medius partial tear, tendonpathy  Plan: Patient will undergo a left hip chronic bursitis and gluteal medius partial tear, tendonpathy on 06/19/2017 per Dr. Charlann [facility]erlin [facility]. Risks benefits and expectations were discussed with the patient. Patient understand risks, benefits and expectations and wishes to proceed.   Anastasio AuerbachMatthew S. Cleone Hulick   PA-C  06/18/2017, 2:56 PM

## 2017-06-19 ENCOUNTER — Ambulatory Visit (HOSPITAL_COMMUNITY): Payer: BLUE CROSS/BLUE SHIELD | Admitting: Anesthesiology

## 2017-06-19 ENCOUNTER — Inpatient Hospital Stay (HOSPITAL_COMMUNITY): Payer: BLUE CROSS/BLUE SHIELD

## 2017-06-19 ENCOUNTER — Other Ambulatory Visit: Payer: Self-pay

## 2017-06-19 ENCOUNTER — Encounter (HOSPITAL_COMMUNITY)
Admission: AD | Disposition: A | Discharge status: Home / Self Care | Payer: Self-pay | Source: Ambulatory Visit | Attending: Orthopedic Surgery

## 2017-06-19 ENCOUNTER — Encounter (HOSPITAL_COMMUNITY): Payer: Self-pay | Admitting: Anesthesiology

## 2017-06-19 ENCOUNTER — Ambulatory Visit (HOSPITAL_COMMUNITY)
Admission: AD | Admit: 2017-06-19 | Discharge: 2017-06-20 | Disposition: A | Discharge status: Home / Self Care | Payer: BLUE CROSS/BLUE SHIELD | Source: Ambulatory Visit | Attending: Orthopedic Surgery | Admitting: Orthopedic Surgery

## 2017-06-19 DIAGNOSIS — X58XXXA Exposure to other specified factors, initial encounter: Secondary | ICD-10-CM | POA: Diagnosis not present

## 2017-06-19 DIAGNOSIS — F419 Anxiety disorder, unspecified: Secondary | ICD-10-CM | POA: Insufficient documentation

## 2017-06-19 DIAGNOSIS — K219 Gastro-esophageal reflux disease without esophagitis: Secondary | ICD-10-CM | POA: Insufficient documentation

## 2017-06-19 DIAGNOSIS — M7072 Other bursitis of hip, left hip: Secondary | ICD-10-CM | POA: Diagnosis not present

## 2017-06-19 DIAGNOSIS — Z79899 Other long term (current) drug therapy: Secondary | ICD-10-CM | POA: Diagnosis not present

## 2017-06-19 DIAGNOSIS — G4730 Sleep apnea, unspecified: Secondary | ICD-10-CM | POA: Diagnosis not present

## 2017-06-19 DIAGNOSIS — M7062 Trochanteric bursitis, left hip: Secondary | ICD-10-CM | POA: Diagnosis not present

## 2017-06-19 DIAGNOSIS — Z87892 Personal history of anaphylaxis: Secondary | ICD-10-CM | POA: Insufficient documentation

## 2017-06-19 DIAGNOSIS — Z882 Allergy status to sulfonamides status: Secondary | ICD-10-CM | POA: Insufficient documentation

## 2017-06-19 DIAGNOSIS — M7580 Other shoulder lesions, unspecified shoulder: Secondary | ICD-10-CM | POA: Diagnosis not present

## 2017-06-19 DIAGNOSIS — M719 Bursopathy, unspecified: Secondary | ICD-10-CM | POA: Diagnosis present

## 2017-06-19 DIAGNOSIS — Z87891 Personal history of nicotine dependence: Secondary | ICD-10-CM | POA: Insufficient documentation

## 2017-06-19 DIAGNOSIS — S76312A Strain of muscle, fascia and tendon of the posterior muscle group at thigh level, left thigh, initial encounter: Secondary | ICD-10-CM | POA: Diagnosis not present

## 2017-06-19 DIAGNOSIS — F329 Major depressive disorder, single episode, unspecified: Secondary | ICD-10-CM | POA: Diagnosis not present

## 2017-06-19 DIAGNOSIS — I10 Essential (primary) hypertension: Secondary | ICD-10-CM | POA: Insufficient documentation

## 2017-06-19 DIAGNOSIS — Z96649 Presence of unspecified artificial hip joint: Secondary | ICD-10-CM

## 2017-06-19 DIAGNOSIS — M7602 Gluteal tendinitis, left hip: Secondary | ICD-10-CM | POA: Diagnosis not present

## 2017-06-19 DIAGNOSIS — M67854 Other specified disorders of tendon, left hip: Secondary | ICD-10-CM | POA: Diagnosis not present

## 2017-06-19 HISTORY — PX: EXCISION/RELEASE BURSA HIP: SHX5014

## 2017-06-19 LAB — TYPE AND SCREEN
ABO/RH(D): A POS
Antibody Screen: NEGATIVE

## 2017-06-19 SURGERY — RELEASE, BURSA, TROCHANTERIC
Anesthesia: Spinal | Site: Hip | Laterality: Left

## 2017-06-19 MED ORDER — ONDANSETRON HCL 4 MG/2ML IJ SOLN
INTRAMUSCULAR | Status: DC | PRN
  Administered 2017-06-19: 4 mg via INTRAVENOUS

## 2017-06-19 MED ORDER — ONDANSETRON HCL 4 MG/2ML IJ SOLN: 4.0000 mg | Freq: Four times a day (QID) | INTRAMUSCULAR | Status: DC | PRN

## 2017-06-19 MED ORDER — DOCUSATE SODIUM 100 MG PO CAPS
100.0000 mg | ORAL_CAPSULE | Freq: Two times a day (BID) | ORAL | Status: DC
  Administered 2017-06-19 – 2017-06-20 (×2): 100 mg via ORAL
  Filled 2017-06-19 (×2): qty 1

## 2017-06-19 MED ORDER — MENTHOL 3 MG MT LOZG: 1.0000 | LOZENGE | OROMUCOSAL | Status: DC | PRN

## 2017-06-19 MED ORDER — PROPOFOL 500 MG/50ML IV EMUL
INTRAVENOUS | Status: DC | PRN
  Administered 2017-06-19 (×2): 10 ug via INTRAVENOUS

## 2017-06-19 MED ORDER — CEFAZOLIN SODIUM-DEXTROSE 2-4 GM/100ML-% IV SOLN
INTRAVENOUS | Status: AC
  Filled 2017-06-19: qty 100

## 2017-06-19 MED ORDER — FENTANYL CITRATE (PF) 100 MCG/2ML IJ SOLN
25.0000 ug | INTRAMUSCULAR | Status: DC | PRN
  Administered 2017-06-19 (×2): 50 ug via INTRAVENOUS

## 2017-06-19 MED ORDER — ROSUVASTATIN CALCIUM 20 MG PO TABS
20.0000 mg | ORAL_TABLET | Freq: Every day | ORAL | Status: DC
  Administered 2017-06-19: 20 mg via ORAL
  Filled 2017-06-19: qty 1

## 2017-06-19 MED ORDER — ONDANSETRON HCL 4 MG PO TABS: 4.0000 mg | ORAL_TABLET | Freq: Four times a day (QID) | ORAL | Status: DC | PRN

## 2017-06-19 MED ORDER — METOCLOPRAMIDE HCL 5 MG PO TABS: 5.0000 mg | ORAL_TABLET | Freq: Three times a day (TID) | ORAL | Status: DC | PRN

## 2017-06-19 MED ORDER — POLYETHYLENE GLYCOL 3350 17 G PO PACK
17.0000 g | PACK | Freq: Two times a day (BID) | ORAL | Status: DC
  Administered 2017-06-19: 17 g via ORAL
  Filled 2017-06-19: qty 1

## 2017-06-19 MED ORDER — FENTANYL CITRATE (PF) 100 MCG/2ML IJ SOLN
INTRAMUSCULAR | Status: AC
  Filled 2017-06-19: qty 2

## 2017-06-19 MED ORDER — MIDAZOLAM HCL 5 MG/5ML IJ SOLN
INTRAMUSCULAR | Status: DC | PRN
  Administered 2017-06-19: 2 mg via INTRAVENOUS

## 2017-06-19 MED ORDER — LACTATED RINGERS IV SOLN
INTRAVENOUS | Status: DC | PRN
  Administered 2017-06-19 (×2): via INTRAVENOUS

## 2017-06-19 MED ORDER — FAMOTIDINE 20 MG PO TABS
20.0000 mg | ORAL_TABLET | Freq: Two times a day (BID) | ORAL | Status: DC
  Administered 2017-06-19 – 2017-06-20 (×2): 20 mg via ORAL
  Filled 2017-06-19 (×2): qty 1

## 2017-06-19 MED ORDER — PROPOFOL 10 MG/ML IV BOLUS
INTRAVENOUS | Status: AC
  Filled 2017-06-19: qty 40

## 2017-06-19 MED ORDER — SODIUM CHLORIDE 0.9 % IV SOLN
INTRAVENOUS | Status: DC
  Administered 2017-06-19: 13:00:00 via INTRAVENOUS

## 2017-06-19 MED ORDER — OXYCODONE HCL 5 MG PO TABS: 5.0000 mg | ORAL_TABLET | Freq: Once | ORAL | Status: DC | PRN

## 2017-06-19 MED ORDER — ACETAMINOPHEN 325 MG PO TABS: 325.0000 mg | ORAL_TABLET | Freq: Four times a day (QID) | ORAL | Status: DC | PRN

## 2017-06-19 MED ORDER — DEXAMETHASONE SODIUM PHOSPHATE 10 MG/ML IJ SOLN
10.0000 mg | Freq: Once | INTRAMUSCULAR | Status: AC
  Administered 2017-06-20: 10 mg via INTRAVENOUS
  Filled 2017-06-19: qty 1

## 2017-06-19 MED ORDER — PROPOFOL 500 MG/50ML IV EMUL
INTRAVENOUS | Status: DC | PRN
  Administered 2017-06-19: 100 ug/kg/min via INTRAVENOUS

## 2017-06-19 MED ORDER — LIDOCAINE 2% (20 MG/ML) 5 ML SYRINGE
INTRAMUSCULAR | Status: AC
  Filled 2017-06-19: qty 5

## 2017-06-19 MED ORDER — TRAZODONE HCL 100 MG PO TABS: 100.0000 mg | ORAL_TABLET | Freq: Every day | ORAL | Status: DC

## 2017-06-19 MED ORDER — LACTATED RINGERS IV SOLN
INTRAVENOUS | Status: DC
  Administered 2017-06-19: 07:00:00 via INTRAVENOUS

## 2017-06-19 MED ORDER — FENTANYL CITRATE (PF) 100 MCG/2ML IJ SOLN
INTRAMUSCULAR | Status: DC | PRN
  Administered 2017-06-19: 50 ug via INTRAVENOUS

## 2017-06-19 MED ORDER — DEXAMETHASONE SODIUM PHOSPHATE 10 MG/ML IJ SOLN
10.0000 mg | Freq: Once | INTRAMUSCULAR | Status: AC
  Administered 2017-06-19: 10 mg via INTRAVENOUS

## 2017-06-19 MED ORDER — BUPIVACAINE IN DEXTROSE 0.75-8.25 % IT SOLN
INTRATHECAL | Status: DC | PRN
  Administered 2017-06-19: 2 mL via INTRATHECAL

## 2017-06-19 MED ORDER — BIOTIN 2500 MCG PO CAPS: 2500.0000 ug | ORAL_CAPSULE | Freq: Every day | ORAL | Status: DC

## 2017-06-19 MED ORDER — DICYCLOMINE HCL 20 MG PO TABS: 20.0000 mg | ORAL_TABLET | Freq: Every day | ORAL | Status: DC | PRN

## 2017-06-19 MED ORDER — TRANEXAMIC ACID 1000 MG/10ML IV SOLN
1000.0000 mg | Freq: Once | INTRAVENOUS | Status: AC
  Administered 2017-06-19: 1000 mg via INTRAVENOUS
  Filled 2017-06-19: qty 1100

## 2017-06-19 MED ORDER — CEFAZOLIN SODIUM-DEXTROSE 2-4 GM/100ML-% IV SOLN
2.0000 g | INTRAVENOUS | Status: AC
  Administered 2017-06-19: 2 g via INTRAVENOUS

## 2017-06-19 MED ORDER — DIPHENHYDRAMINE HCL 12.5 MG/5ML PO ELIX: 12.5000 mg | ORAL_SOLUTION | ORAL | Status: DC | PRN

## 2017-06-19 MED ORDER — MORPHINE SULFATE (PF) 4 MG/ML IV SOLN: 0.5000 mg | INTRAVENOUS | Status: DC | PRN

## 2017-06-19 MED ORDER — FERROUS SULFATE 325 (65 FE) MG PO TABS
325.0000 mg | ORAL_TABLET | Freq: Three times a day (TID) | ORAL | Status: DC
  Administered 2017-06-19 – 2017-06-20 (×2): 325 mg via ORAL
  Filled 2017-06-19 (×2): qty 1

## 2017-06-19 MED ORDER — CEFAZOLIN SODIUM-DEXTROSE 2-4 GM/100ML-% IV SOLN
2.0000 g | Freq: Four times a day (QID) | INTRAVENOUS | Status: AC
  Administered 2017-06-19 (×2): 2 g via INTRAVENOUS
  Filled 2017-06-19 (×2): qty 100

## 2017-06-19 MED ORDER — MIDAZOLAM HCL 2 MG/2ML IJ SOLN
INTRAMUSCULAR | Status: AC
  Filled 2017-06-19: qty 2

## 2017-06-19 MED ORDER — HYDROCODONE-ACETAMINOPHEN 5-325 MG PO TABS
1.0000 | ORAL_TABLET | ORAL | Status: DC | PRN
  Administered 2017-06-19 – 2017-06-20 (×5): 2 via ORAL
  Filled 2017-06-19 (×4): qty 2

## 2017-06-19 MED ORDER — HYDROCODONE-ACETAMINOPHEN 5-325 MG PO TABS
ORAL_TABLET | ORAL | Status: AC
  Administered 2017-06-19: 2 via ORAL
  Filled 2017-06-19: qty 2

## 2017-06-19 MED ORDER — ESCITALOPRAM OXALATE 20 MG PO TABS
20.0000 mg | ORAL_TABLET | Freq: Every day | ORAL | Status: DC
  Administered 2017-06-20: 20 mg via ORAL
  Filled 2017-06-19: qty 1

## 2017-06-19 MED ORDER — SODIUM CHLORIDE 0.9 % IR SOLN
Status: DC | PRN
  Administered 2017-06-19: 1000 mL

## 2017-06-19 MED ORDER — PHENOL 1.4 % MT LIQD: 1.0000 | OROMUCOSAL | Status: DC | PRN

## 2017-06-19 MED ORDER — SODIUM CHLORIDE 0.9 % IV SOLN
1000.0000 mg | INTRAVENOUS | Status: AC
  Administered 2017-06-19: 1000 mg via INTRAVENOUS
  Filled 2017-06-19: qty 1100

## 2017-06-19 MED ORDER — MAGNESIUM CITRATE PO SOLN: 1.0000 | Freq: Once | ORAL | Status: DC | PRN

## 2017-06-19 MED ORDER — HYDROCODONE-ACETAMINOPHEN 7.5-325 MG PO TABS: 1.0000 | ORAL_TABLET | ORAL | Status: DC | PRN

## 2017-06-19 MED ORDER — ALUM & MAG HYDROXIDE-SIMETH 200-200-20 MG/5ML PO SUSP: 15.0000 mL | ORAL | Status: DC | PRN

## 2017-06-19 MED ORDER — DEXAMETHASONE SODIUM PHOSPHATE 10 MG/ML IJ SOLN
INTRAMUSCULAR | Status: AC
  Filled 2017-06-19: qty 1

## 2017-06-19 MED ORDER — ASPIRIN 81 MG PO CHEW
81.0000 mg | CHEWABLE_TABLET | Freq: Two times a day (BID) | ORAL | Status: DC
  Administered 2017-06-19 – 2017-06-20 (×2): 81 mg via ORAL
  Filled 2017-06-19: qty 1

## 2017-06-19 MED ORDER — METHOCARBAMOL 500 MG PO TABS
500.0000 mg | ORAL_TABLET | Freq: Four times a day (QID) | ORAL | Status: DC | PRN
  Administered 2017-06-19: 500 mg via ORAL
  Filled 2017-06-19: qty 1

## 2017-06-19 MED ORDER — LIDOCAINE HCL (CARDIAC) 20 MG/ML IV SOLN
INTRAVENOUS | Status: DC | PRN
  Administered 2017-06-19: 50 mg via INTRAVENOUS

## 2017-06-19 MED ORDER — TRAMADOL HCL 50 MG PO TABS
50.0000 mg | ORAL_TABLET | Freq: Four times a day (QID) | ORAL | Status: DC
  Administered 2017-06-19 – 2017-06-20 (×3): 50 mg via ORAL
  Filled 2017-06-19 (×3): qty 1

## 2017-06-19 MED ORDER — CHLORHEXIDINE GLUCONATE 4 % EX LIQD: 60.0000 mL | Freq: Once | CUTANEOUS | Status: DC

## 2017-06-19 MED ORDER — METHOCARBAMOL 1000 MG/10ML IJ SOLN
500.0000 mg | Freq: Four times a day (QID) | INTRAMUSCULAR | Status: DC | PRN
  Administered 2017-06-19: 500 mg via INTRAVENOUS
  Filled 2017-06-19: qty 550

## 2017-06-19 MED ORDER — BISACODYL 10 MG RE SUPP: 10.0000 mg | Freq: Every day | RECTAL | Status: DC | PRN

## 2017-06-19 MED ORDER — OXYCODONE HCL 5 MG/5ML PO SOLN
5.0000 mg | Freq: Once | ORAL | Status: DC | PRN
  Filled 2017-06-19: qty 5

## 2017-06-19 MED ORDER — ONDANSETRON HCL 4 MG/2ML IJ SOLN
INTRAMUSCULAR | Status: AC
  Filled 2017-06-19: qty 2

## 2017-06-19 MED ORDER — METOCLOPRAMIDE HCL 5 MG/ML IJ SOLN: 5.0000 mg | Freq: Three times a day (TID) | INTRAMUSCULAR | Status: DC | PRN

## 2017-06-19 SURGICAL SUPPLY — 39 items
BAG ZIPLOCK 12X15 (MISCELLANEOUS) ×2 IMPLANT
BIT DRILL 2.0X128 (BIT) ×2 IMPLANT
COVER SURGICAL LIGHT HANDLE (MISCELLANEOUS) ×2 IMPLANT
DERMABOND ADVANCED (GAUZE/BANDAGES/DRESSINGS) ×1
DERMABOND ADVANCED .7 DNX12 (GAUZE/BANDAGES/DRESSINGS) ×1 IMPLANT
DRAPE INCISE IOBAN 85X60 (DRAPES) ×2 IMPLANT
DRAPE ORTHO SPLIT 77X108 STRL (DRAPES) ×1
DRAPE POUCH INSTRU U-SHP 10X18 (DRAPES) ×2 IMPLANT
DRAPE SURG 17X11 SM STRL (DRAPES) ×2 IMPLANT
DRAPE SURG ORHT 6 SPLT 77X108 (DRAPES) ×1 IMPLANT
DRAPE U-SHAPE 47X51 STRL (DRAPES) ×2 IMPLANT
DRSG AQUACEL AG ADV 3.5X10 (GAUZE/BANDAGES/DRESSINGS) ×2 IMPLANT
DURAPREP 26ML APPLICATOR (WOUND CARE) ×2 IMPLANT
ELECT REM PT RETURN 15FT ADLT (MISCELLANEOUS) ×2 IMPLANT
GAUZE SPONGE 4X4 12PLY STRL (GAUZE/BANDAGES/DRESSINGS) ×2 IMPLANT
GLOVE BIOGEL PI IND STRL 7.5 (GLOVE) ×2 IMPLANT
GLOVE BIOGEL PI IND STRL 8.5 (GLOVE) ×1 IMPLANT
GLOVE BIOGEL PI INDICATOR 7.5 (GLOVE) ×2
GLOVE BIOGEL PI INDICATOR 8.5 (GLOVE) ×1
GLOVE ECLIPSE 8.0 STRL XLNG CF (GLOVE) ×2 IMPLANT
GLOVE ORTHO TXT STRL SZ7.5 (GLOVE) ×2 IMPLANT
GOWN STRL REUS W/TWL LRG LVL3 (GOWN DISPOSABLE) ×4 IMPLANT
GOWN STRL REUS W/TWL XL LVL3 (GOWN DISPOSABLE) ×4 IMPLANT
KIT BASIN OR (CUSTOM PROCEDURE TRAY) ×2 IMPLANT
MANIFOLD NEPTUNE II (INSTRUMENTS) ×2 IMPLANT
NEEDLE MA TROC 1/2 (NEEDLE) ×2 IMPLANT
NS IRRIG 1000ML POUR BTL (IV SOLUTION) ×2 IMPLANT
PACK TOTAL JOINT (CUSTOM PROCEDURE TRAY) ×2 IMPLANT
POSITIONER SURGICAL ARM (MISCELLANEOUS) ×2 IMPLANT
SHIELD SPLASH 9X12 PIC/PGM (MISCELLANEOUS) ×6 IMPLANT
SUT ETHIBOND NAB CT1 #1 30IN (SUTURE) IMPLANT
SUT FIBERWIRE #2 38 T-5 BLUE (SUTURE) ×2
SUT MNCRL AB 4-0 PS2 18 (SUTURE) ×2 IMPLANT
SUT VIC AB 1 CT1 27 (SUTURE) ×2
SUT VIC AB 1 CT1 27XBRD ANTBC (SUTURE) ×2 IMPLANT
SUT VIC AB 2-0 CT1 27 (SUTURE) ×2
SUT VIC AB 2-0 CT1 TAPERPNT 27 (SUTURE) ×2 IMPLANT
SUTURE FIBERWR #2 38 T-5 BLUE (SUTURE) ×1 IMPLANT
TOWEL OR 17X26 10 PK STRL BLUE (TOWEL DISPOSABLE) ×4 IMPLANT

## 2017-06-19 NOTE — Plan of Care (Signed)
Plan of care 

## 2017-06-19 NOTE — Progress Notes (Signed)
Pt. has home CPAP s/u @ bedside, made aware to notify if needed.

## 2017-06-19 NOTE — Anesthesia Postprocedure Evaluation (Signed)
Anesthesia Post Note  Patient: Tracy Atkins  Procedure(s) Performed: Left hip open bursectomy with  gluteal tendon repair (Left Hip)     Patient location during evaluation: PACU Anesthesia Type: Spinal Level of consciousness: oriented and awake and alert Pain management: pain level controlled Vital Signs Assessment: post-procedure vital signs reviewed and stable Respiratory status: spontaneous breathing, respiratory function stable and patient connected to nasal cannula oxygen Cardiovascular status: blood pressure returned to baseline and stable Postop Assessment: no headache, no backache and no apparent nausea or vomiting Anesthetic complications: no    Last Vitals:  Vitals:   06/19/17 1300 06/19/17 1320  BP: 134/80 134/80  Pulse: 82 87  Resp: 14 14  Temp: 36.7 C (!) 36.4 C  SpO2: 96% 96%    Last Pain:  Vitals:   06/19/17 1320  TempSrc: Oral  PainSc:                  Edmundo Tedesco S

## 2017-06-19 NOTE — Op Note (Signed)
NAMMarland Kitchen:  Tracy Atkins, Tracy Atkins                  ACCOUNT NO.:  [id number]  MEDICAL RECORD NO.:  [id number]  LOCATION:                                 FACILITY:  PHYSICIAN:  Madlyn FrankelMatthew D. Charlann Boxerlin, M.D.       DATE OF BIRTH:  DATE OF PROCEDURE:  06/19/2017 DATE OF DISCHARGE:                              OPERATIVE REPORT   PREOPERATIVE DIAGNOSES:  Chronic left hip pain, bursitis, partial tearing of gluteus medius with associated tendinopathy.  POSTOPERATIVE DIAGNOSIS:  Chronic left hip pain, bursitis, partial tearing of gluteus medius with associated tendinopathy.  PROCEDURE:  Open left hip bursectomy with gluteus medius tendon repair to bone.  This included a recession of the iliotibial band over the top of the lateral border of the greater trochanter.  I used #2 FiberWire sutures for the repair through bone holes.  SURGEON:  Madlyn FrankelMatthew D. Charlann Boxerlin, M.D.  ASSISTANT:  Lanney GinsMatthew Babish, PA-C.  ANESTHESIA:  Spinal.  SPECIMENS:  None.  COMPLICATIONS:  None.  BLOOD LOSS:  Minimal.  INDICATION FOR PROCEDURE:  Ms. Tracy BlazingSmith is a 57 year old female who had been seen and evaluated, managed in the office for chronic lateral left hip pain.  She had multiple injections failed conservative treatment, physical therapy, icing, stretching, medications.  Subsequently, an MRI was ordered, which had evidence of partial tearing of the gluteus medius without significant atrophy, but tendinopathy noted.  She, at this point, wished to proceed with more definitive measures.  We discussed the risks and benefits.  The predominant risks were the limitations of predictable outcome with this type of procedure due to limited numbers that were performed.  We discussed the risk of infection.  We discussed the risk of persistent pain, persistent limp and need for future surgeries.  Consent was obtained for benefit of pain relief.  PROCEDURE IN DETAIL:  The patient was brought to the operative theater. Once adequate anesthesia,  preoperative antibiotics, Ancef, tranexamic acid and Decadron administered, she was positioned into the right lateral decubitus position with left hip up.  Left lower extremity was then prepped and draped in sterile fashion.  Time-out was performed identifying the patient, planned procedure and extremity.  A lateral based incision was made over the trochanter that was palpable. Soft tissue dissection was carried down to the iliotibial band.  There was noted to be about 2 inches of adipose tissue in this area.  She was noted to be tight particularly with any kind of neutral to adducted position of the leg.  I positioned the leg in a more neutral position with towels and then incised the iliotibial band from below the vastus ridge over the area proximal to the trochanter.  We did encounter some bursal fluid in this area.  I excised the bursa.  I identified in the anterior-superior aspect of this left greater trochanter.  Looking down all noted to be approximately in the area about of 10 o'clock to 12 o'clock where there was palpable defects in the gluteus tendon.  I excised chronic tendon change in this area.  I then identified that the muscle was functioning as it stimulated to cautery and was bleeding.  I then used clamps  to confirm that we could get it back to bone adequately.  At this point, I made through the drill holes in this proximal aspect of the trochanter, spaced appropriately.  I then used #2 FiberWire suture and I passed it in a pattern similar to that for rotator cuff tear, allowing for mobility of the suture, but also to provide some pulling strength.  The two suture strands were passed, leaving four strands of suture.  One strand was passed distally, the central two strands were passed through the central hole using the free needle and the final suture was passed through the hole near the 12 o'clock position.  At this point, I identified each strand in combination with  each other.  I then having Lanney Gins, pull tension on two strands, I sutured down the upper two strands together and then the lower two.  I then sutured the four strands together and cut the suture.  When this was completed, I did not have a palpable defect proximally. Please note to that I did use a rongeur and remove some of the cortical bone off the tip of the trochanter in the same location to allow for bone stimulated healing.  Once this was completed, now completing partial bursectomy as well as repair of the tendon, I chose to recess the iliotibial band over the area of the vastus insertion where the most prominent lateral aspect of the trochanter by creating a cross pattern.  I then used #1 Vicryl distal to this and then reapproximating the iliotibial band and the gluteal fascia.  The wound was then irrigated with normal saline solution.  Rest of the wound was closed in layers using 2-0 Vicryl and a running Monocryl stitch.  The hip was then cleaned, dried and dressed sterilely using surgical glue and Aquacel dressing.  She was then brought to the recovery room in stable condition tolerating the procedure well.  Postoperatively, I will keep her in the hospital overnight.  We will have Physical Therapy assess, she will have her use a walker with protective weightbearing, but then no active abduction for probably 6 weeks to allow for tendon healing.     Madlyn Frankel Charlann Boxer, M.D.     MDO/MEDQ  D:  06/19/2017  T:  06/19/2017  Job:  [id number]

## 2017-06-19 NOTE — Transfer of Care (Signed)
Immediate Anesthesia Transfer of Care Note  Patient: Tracy Atkins  Procedure(s) Performed: Left hip open bursectomy with  gluteal tendon repair (Left Hip)  Patient Location: PACU  Anesthesia Type:Spinal  Level of Consciousness: awake, alert  and oriented  Airway & Oxygen Therapy: Patient Spontanous Breathing and Patient connected to face mask oxygen  Post-op Assessment: Report given to RN and Post -op Vital signs reviewed and stable  Post vital signs: Reviewed and stable  Last Vitals:  Vitals:   06/19/17 0712  BP: (!) 158/77  Pulse: 77  Resp: 18  Temp: 36.8 C  SpO2: 98%    Last Pain:  Vitals:   06/19/17 0720  TempSrc:   PainSc: 4       Patients Stated Pain Goal: 4 (06/19/17 0720)  Complications: No apparent anesthesia complications

## 2017-06-19 NOTE — Interval H&P Note (Signed)
History and Physical Interval Note:  06/19/2017 7:18 AM  Tracy Atkins  has presented today for surgery, with the diagnosis of Left gluteal medius partial tearing, tendonopathy  The various methods of treatment have been discussed with the patient and family. After consideration of risks, benefits and other options for treatment, the patient has consented to  Procedure(s) with comments: Left hip open bursectomy with possible gluteal tendon repair (Left) - 60 mins as a surgical intervention .  The patient's history has been reviewed, patient examined, no change in status, stable for surgery.  I have reviewed the patient's chart and labs.  Questions were answered to the patient's satisfaction.     Shelda PalMatthew D Audree Schrecengost

## 2017-06-19 NOTE — Brief Op Note (Signed)
06/19/2017  10:15 AM  PATIENT:  Tracy Atkins  57 y.o. female  PRE-OPERATIVE DIAGNOSIS:  Left gluteal medius partial tearing, tendonopathy  POST-OPERATIVE DIAGNOSIS:  Left gluteal medius partial tearing, tendonopathy, chronic bursitis  PROCEDURE:  Procedure(s) with comments: Left hip open bursectomy with  gluteal tendon repair (Left) - 60 mins  SURGEON:  Surgeon(s) and Role:    Durene Romans* Timberly Yott, MD - Primary  PHYSICIAN ASSISTANT: Lanney GinsMatthew Babish, PA-C  ANESTHESIA:   spinal  EBL:  10 mL   BLOOD ADMINISTERED:none  DRAINS: none   LOCAL MEDICATIONS USED:  NONE  SPECIMEN:  No Specimen  DISPOSITION OF SPECIMEN:  N/A  COUNTS:  YES  TOURNIQUET:  * No tourniquets in log *  DICTATION: .Other Dictation: Dictation Number [id number]  PLAN OF CARE: Admit for overnight observation  PATIENT DISPOSITION:  PACU - hemodynamically stable.   Delay start of Pharmacological VTE agent (>24hrs) due to surgical blood loss or risk of bleeding: no

## 2017-06-19 NOTE — Evaluation (Signed)
Physical Therapy Evaluation Patient Details Name: Tracy Atkins MRN: [id number] DOB: February 26, 1961 Today's Date: 06/19/2017   History of Present Illness  Pt is a 57 year old female s/p Left hip open bursectomy with gluteal tendon repair   Clinical Impression  Patient is s/p above surgery resulting in functional limitations due to the deficits listed below (see PT Problem List).  Patient will benefit from skilled PT to increase their independence and safety with mobility to allow discharge to the venue listed below.  Pt educated on no active hip abduction and maintaining precaution during mobility.  Pt plans to d/c home tomorrow with spouse.  Will return to ambulate again and practice steps prior to d/c.     Follow Up Recommendations No PT follow up;Follow surgeon's recommendation for DC plan and follow-up therapies    Equipment Recommendations  None recommended by PT    Recommendations for Other Services       Precautions / Restrictions Precautions Precautions: Fall Precaution Comments: No active hip abduction      Mobility  Bed Mobility Overal bed mobility: Needs Assistance Bed Mobility: Supine to Sit     Supine to sit: Min guard     General bed mobility comments: verbal cues for maintaining hip precaution  Transfers Overall transfer level: Needs assistance Equipment used: Rolling walker (2 wheeled) Transfers: Sit to/from [employer] CorporationStand;Stand Pivot Transfers Sit to Stand: Min assist Stand pivot transfers: Min guard       General transfer comment: slight assist to rise and steady initially, min/guard from [facility], requested to urinate prior to ambulating  Ambulation/Gait Ambulation/Gait assistance: Min guard Ambulation Distance (Feet): 60 Feet Assistive device: Rolling walker (2 wheeled) Gait Pattern/deviations: Step-to pattern;Decreased stance time - left;Antalgic     General Gait Details: verbal cues for sequence, RW positioning, step length  Stairs             Wheelchair Mobility    Modified Rankin (Stroke Patients Only)       Balance                                             Pertinent Vitals/Pain Pain Assessment: 0-10 Pain Score: 5  Pain Location: L hip Pain Intervention(s): Limited activity within patient's tolerance;Monitored during session;Repositioned;Patient requesting pain meds-RN notified    Home Living Family/patient expects to be discharged to:: Private residence Living Arrangements: Spouse/significant other   Type of Home: House Home Access: Stairs to enter   Secretary/administratorntrance Stairs-Number of Steps: 3 Home Layout: One level Home Equipment: Walker - 2 wheels      Prior Function Level of Independence: Independent               Hand Dominance        Extremity/Trunk Assessment        Lower Extremity Assessment Lower Extremity Assessment: LLE deficits/detail LLE Deficits / Details: able to perform ankle pumps, educated on no active hip abduction       Communication   Communication: No difficulties  Cognition Arousal/Alertness: Awake/alert Behavior During Therapy: WFL for tasks assessed/performed Overall Cognitive Status: Within Functional Limits for tasks assessed                                        General Comments  Exercises     Assessment/Plan    PT Assessment Patient needs continued PT services  PT Problem List Decreased strength;Decreased mobility;Decreased activity tolerance;Decreased knowledge of use of DME;Decreased knowledge of precautions;Pain       PT Treatment Interventions Gait training;Therapeutic activities;DME instruction;Therapeutic exercise;Patient/family education;Functional mobility training;Stair training    PT Goals (Current goals can be found in the Care Plan section)  Acute Rehab PT Goals PT Goal Formulation: With patient Time For Goal Achievement: 06/23/17 Potential to Achieve Goals: Good    Frequency Min 6X/week    Barriers to discharge        Co-evaluation               AM-PAC PT "6 Clicks" Daily Activity  Outcome Measure Difficulty turning over in bed (including adjusting bedclothes, sheets and blankets)?: None Difficulty moving from lying on back to sitting on the side of the bed? : A Lot Difficulty sitting down on and standing up from a chair with arms (e.g., wheelchair, bedside commode, etc,.)?: Unable Help needed moving to and from a bed to chair (including a wheelchair)?: A Little Help needed walking in hospital room?: A Little Help needed climbing 3-5 steps with a railing? : A Lot 6 Click Score: 15    End of Session Equipment Utilized During Treatment: Gait belt Activity Tolerance: Patient tolerated treatment well Patient left: in chair;with call bell/phone within reach;with chair alarm set;with family/visitor present Nurse Communication: Mobility status;Patient requests pain meds PT Visit Diagnosis: Other abnormalities of gait and mobility (R26.89)    Time: 4098-1191 PT Time Calculation (min) (ACUTE ONLY): 19 min   Charges:   PT Evaluation $PT Eval Low Complexity: 1 Low     PT G Codes:       Zenovia Jarred, PT, DPT 06/19/2017 Pager: [phone]  Maida Sale E 06/19/2017, 5:47 PM

## 2017-06-19 NOTE — Anesthesia Procedure Notes (Signed)
Spinal  Patient location during procedure: OR Start time: 06/19/2017 8:55 AM End time: 06/19/2017 8:58 AM Staffing Anesthesiologist: Achille RichHodierne, Bernadean Saling, MD Performed: anesthesiologist  Preanesthetic Checklist Completed: patient identified, surgical consent, pre-op evaluation, timeout performed, IV checked, risks and benefits discussed and monitors and equipment checked Spinal Block Patient position: sitting Prep: DuraPrep Patient monitoring: cardiac monitor, continuous pulse ox and blood pressure Approach: midline Location: L3-4 Injection technique: single-shot Needle Needle type: Pencan  Needle gauge: 24 G Needle length: 9 cm Assessment Sensory level: T10 Additional Notes Functioning IV was confirmed and monitors were applied. Sterile prep and drape, including hand hygiene and sterile gloves were used. The patient was positioned and the spine was prepped. The skin was anesthetized with lidocaine.  Free flow of clear CSF was obtained prior to injecting local anesthetic into the CSF.  The spinal needle aspirated freely following injection.  The needle was carefully withdrawn.  The patient tolerated the procedure well.

## 2017-06-19 NOTE — Anesthesia Preprocedure Evaluation (Signed)
Anesthesia Evaluation  Patient identified by MRN, date of birth, ID band Patient awake    Reviewed: Allergy & Precautions, H&P , NPO status , Patient's Chart, lab work & pertinent test results  History of Anesthesia Complications (+) PONV and history of anesthetic complications  Airway Mallampati: II   Neck ROM: full    Dental   Pulmonary former smoker,    breath sounds clear to auscultation       Cardiovascular hypertension,  Rhythm:regular Rate:Normal     Neuro/Psych PSYCHIATRIC DISORDERS Anxiety Depression    GI/Hepatic GERD  ,  Endo/Other    Renal/GU      Musculoskeletal   Abdominal   Peds  Hematology   Anesthesia Other Findings   Reproductive/Obstetrics                             Anesthesia Physical Anesthesia Plan  ASA: II  Anesthesia Plan: Spinal   Post-op Pain Management:    Induction: Intravenous  PONV Risk Score and Plan: 3 and Ondansetron, Propofol infusion and Treatment may vary due to age or medical condition  Airway Management Planned: Simple Face Mask  Additional Equipment:   Intra-op Plan:   Post-operative Plan:   Informed Consent: I have reviewed the patients History and Physical, chart, labs and discussed the procedure including the risks, benefits and alternatives for the proposed anesthesia with the patient or authorized representative who has indicated his/her understanding and acceptance.     Plan Discussed with: CRNA, Anesthesiologist and Surgeon  Anesthesia Plan Comments:         Anesthesia Quick Evaluation

## 2017-06-20 ENCOUNTER — Encounter (HOSPITAL_COMMUNITY): Payer: Self-pay | Admitting: Orthopedic Surgery

## 2017-06-20 DIAGNOSIS — F419 Anxiety disorder, unspecified: Secondary | ICD-10-CM | POA: Diagnosis not present

## 2017-06-20 DIAGNOSIS — I10 Essential (primary) hypertension: Secondary | ICD-10-CM | POA: Diagnosis not present

## 2017-06-20 DIAGNOSIS — Z87891 Personal history of nicotine dependence: Secondary | ICD-10-CM | POA: Diagnosis not present

## 2017-06-20 DIAGNOSIS — S76312A Strain of muscle, fascia and tendon of the posterior muscle group at thigh level, left thigh, initial encounter: Secondary | ICD-10-CM | POA: Diagnosis not present

## 2017-06-20 DIAGNOSIS — X58XXXA Exposure to other specified factors, initial encounter: Secondary | ICD-10-CM | POA: Diagnosis not present

## 2017-06-20 DIAGNOSIS — M7602 Gluteal tendinitis, left hip: Secondary | ICD-10-CM | POA: Diagnosis not present

## 2017-06-20 DIAGNOSIS — Z87892 Personal history of anaphylaxis: Secondary | ICD-10-CM | POA: Diagnosis not present

## 2017-06-20 DIAGNOSIS — F329 Major depressive disorder, single episode, unspecified: Secondary | ICD-10-CM | POA: Diagnosis not present

## 2017-06-20 DIAGNOSIS — Z882 Allergy status to sulfonamides status: Secondary | ICD-10-CM | POA: Diagnosis not present

## 2017-06-20 DIAGNOSIS — G4730 Sleep apnea, unspecified: Secondary | ICD-10-CM | POA: Diagnosis not present

## 2017-06-20 DIAGNOSIS — K219 Gastro-esophageal reflux disease without esophagitis: Secondary | ICD-10-CM | POA: Diagnosis not present

## 2017-06-20 DIAGNOSIS — M7062 Trochanteric bursitis, left hip: Secondary | ICD-10-CM | POA: Diagnosis not present

## 2017-06-20 DIAGNOSIS — Z79899 Other long term (current) drug therapy: Secondary | ICD-10-CM | POA: Diagnosis not present

## 2017-06-20 LAB — BASIC METABOLIC PANEL
ANION GAP: 10 (ref 5–15)
BUN: 12 mg/dL (ref 6–20)
CALCIUM: 9.2 mg/dL (ref 8.9–10.3)
CO2: 27 mmol/L (ref 22–32)
CREATININE: 0.74 mg/dL (ref 0.44–1.00)
Chloride: 104 mmol/L (ref 101–111)
Glucose, Bld: 102 mg/dL — ABNORMAL HIGH (ref 65–99)
Potassium: 4.3 mmol/L (ref 3.5–5.1)
Sodium: 141 mmol/L (ref 135–145)

## 2017-06-20 LAB — CBC
HCT: 37.3 % (ref 36.0–46.0)
Hemoglobin: 12.2 g/dL (ref 12.0–15.0)
MCH: 30.7 pg (ref 26.0–34.0)
MCHC: 32.7 g/dL (ref 30.0–36.0)
MCV: 93.7 fL (ref 78.0–100.0)
PLATELETS: 270 10*3/uL (ref 150–400)
RBC: 3.98 MIL/uL (ref 3.87–5.11)
RDW: 13.6 % (ref 11.5–15.5)
WBC: 10 10*3/uL (ref 4.0–10.5)

## 2017-06-20 MED ORDER — METHOCARBAMOL 500 MG PO TABS: 500.0000 mg | ORAL_TABLET | Freq: Four times a day (QID) | ORAL | 0 refills | Status: DC | PRN

## 2017-06-20 MED ORDER — DOCUSATE SODIUM 100 MG PO CAPS: 100.0000 mg | ORAL_CAPSULE | Freq: Two times a day (BID) | ORAL | 0 refills | Status: DC

## 2017-06-20 MED ORDER — HYDROCODONE-ACETAMINOPHEN 7.5-325 MG PO TABS: 1.0000 | ORAL_TABLET | ORAL | 0 refills | Status: DC | PRN

## 2017-06-20 MED ORDER — POLYETHYLENE GLYCOL 3350 17 G PO PACK: 17.0000 g | PACK | Freq: Two times a day (BID) | ORAL | 0 refills | Status: DC

## 2017-06-20 MED ORDER — ASPIRIN 81 MG PO CHEW: 81.0000 mg | CHEWABLE_TABLET | Freq: Two times a day (BID) | ORAL | 0 refills | Status: AC

## 2017-06-20 MED ORDER — FERROUS SULFATE 325 (65 FE) MG PO TABS: 325.0000 mg | ORAL_TABLET | Freq: Three times a day (TID) | ORAL | 3 refills | Status: DC

## 2017-06-20 NOTE — Progress Notes (Signed)
Physical Therapy Treatment Patient Details Name: Tracy Atkins MRN: [id number] DOB: 13-Aug-1960 Today's Date: 06/20/2017    History of Present Illness Pt is a 57 year old female s/p Left hip open bursectomy with gluteal tendon repair     PT Comments    Pt ambulated in hallway and practiced safe stair technique with spouse observing.  Pt feels ready for d/c home today.  Discussed safe car transfer technique and maintaining no active hip abduction precaution.  Pt reports understanding.    Follow Up Recommendations  No PT follow up;Follow surgeon's recommendation for DC plan and follow-up therapies     Equipment Recommendations  None recommended by PT    Recommendations for Other Services       Precautions / Restrictions Precautions Precautions: Fall Precaution Comments: No active hip abduction Restrictions Other Position/Activity Restrictions: WBAT with RW    Mobility  Bed Mobility               General bed mobility comments: pt up in recliner on arrival  Transfers Overall transfer level: Needs assistance Equipment used: Rolling walker (2 wheeled) Transfers: Sit to/from Stand Sit to Stand: Min guard         General transfer comment: verbal cues for safe technique  Ambulation/Gait Ambulation/Gait assistance: Min guard Ambulation Distance (Feet): 80 Feet Assistive device: Rolling walker (2 wheeled) Gait Pattern/deviations: Step-to pattern;Decreased stance time - left;Antalgic     General Gait Details: verbal cues for sequence, RW positioning, step length   Stairs Stairs: Yes   Stair Management: One rail Left;Step to pattern;Forwards Number of Stairs: 2 General stair comments: performed with both hands on left rail ascending and performed descending forwards with same rail and one hand on shoulder for support, spouse observed, pt reports understanding  Wheelchair Mobility    Modified Rankin (Stroke Patients Only)       Balance                                             Cognition Arousal/Alertness: Awake/alert Behavior During Therapy: WFL for tasks assessed/performed Overall Cognitive Status: Within Functional Limits for tasks assessed                                        Exercises      General Comments        Pertinent Vitals/Pain Pain Assessment: 0-10 Pain Score: 4  Pain Location: L hip Pain Descriptors / Indicators: Aching;Sore Pain Intervention(s): Limited activity within patient's tolerance;Repositioned;Monitored during session    Home Living                      Prior Function            PT Goals (current goals can now be found in the care plan section) Progress towards PT goals: Progressing toward goals    Frequency    Min 6X/week      PT Plan Current plan remains appropriate    Co-evaluation              AM-PAC PT "6 Clicks" Daily Activity  Outcome Measure  Difficulty turning over in bed (including adjusting bedclothes, sheets and blankets)?: None Difficulty moving from lying on back to sitting on the side of the bed? : A  Lot Difficulty sitting down on and standing up from a chair with arms (e.g., wheelchair, bedside commode, etc,.)?: Unable Help needed moving to and from a bed to chair (including a wheelchair)?: A Little Help needed walking in hospital room?: A Little Help needed climbing 3-5 steps with a railing? : A Little 6 Click Score: 16    End of Session Equipment Utilized During Treatment: Gait belt Activity Tolerance: Patient tolerated treatment well Patient left: in chair;with call bell/phone within reach;with chair alarm set;with family/visitor present Nurse Communication: Mobility status PT Visit Diagnosis: Other abnormalities of gait and mobility (R26.89)     Time: [id number] PT Time Calculation (min) (ACUTE ONLY): 21 min  Charges:  $Gait Training: 8-22 mins                    G Codes:     Zenovia JarredKati Laurey Salser, PT,  DPT 06/20/2017 Pager: [phone]  Maida SaleLEMYRE,KATHrine E 06/20/2017, 12:00 PM

## 2017-06-20 NOTE — Progress Notes (Signed)
Patient ID: Tracy Atkins, female   DOB: 02-05-1961, 57 y.o.   MRN: [id number] Subjective: 1 Day Post-Op Procedure(s) (LRB): Left hip open bursectomy with  gluteal tendon repair (Left)    Patient reports pain as moderate.  Reviewed intra-operative findings and procedures.    Objective:   VITALS:   Vitals:   06/20/17 0211 06/20/17 0520  BP: 137/70 139/76  Pulse: 74 79  Resp: 13 15  Temp: 97.8 F (36.6 C) 97.8 F (36.6 C)  SpO2: 99% 99%    Neurovascular intact Incision: dressing C/D/I  LABS Recent Labs    06/20/17 0553  HGB 12.2  HCT 37.3  WBC 10.0  PLT 270    Recent Labs    06/20/17 0553  NA 141  K 4.3  BUN 12  CREATININE 0.74  GLUCOSE 102*    No results for input(s): LABPT, INR in the last 72 hours.   Assessment/Plan: 1 Day Post-Op Procedure(s) (LRB): Left hip open bursectomy with  gluteal tendon repair (Left)   Advance diet Up with therapy  Home today after therapy review of walking with walker and no active abduction for 6 weeks Reviewed goals and expectations RTC in 2 weeks to remove dressing

## 2017-09-13 DIAGNOSIS — L820 Inflamed seborrheic keratosis: Secondary | ICD-10-CM | POA: Diagnosis not present

## 2017-10-02 DIAGNOSIS — M25552 Pain in left hip: Secondary | ICD-10-CM | POA: Diagnosis not present

## 2017-10-23 DIAGNOSIS — M25552 Pain in left hip: Secondary | ICD-10-CM | POA: Diagnosis not present

## 2017-10-27 DIAGNOSIS — M25552 Pain in left hip: Secondary | ICD-10-CM | POA: Diagnosis not present

## 2017-10-30 DIAGNOSIS — M25552 Pain in left hip: Secondary | ICD-10-CM | POA: Diagnosis not present

## 2017-11-03 DIAGNOSIS — M25552 Pain in left hip: Secondary | ICD-10-CM | POA: Diagnosis not present

## 2017-11-06 DIAGNOSIS — M25552 Pain in left hip: Secondary | ICD-10-CM | POA: Diagnosis not present

## 2017-11-08 DIAGNOSIS — M25552 Pain in left hip: Secondary | ICD-10-CM | POA: Diagnosis not present

## 2017-11-13 DIAGNOSIS — M25552 Pain in left hip: Secondary | ICD-10-CM | POA: Diagnosis not present

## 2017-11-15 DIAGNOSIS — M25552 Pain in left hip: Secondary | ICD-10-CM | POA: Diagnosis not present

## 2017-11-27 DIAGNOSIS — M25552 Pain in left hip: Secondary | ICD-10-CM | POA: Diagnosis not present

## 2017-11-29 DIAGNOSIS — M25552 Pain in left hip: Secondary | ICD-10-CM | POA: Diagnosis not present

## 2017-12-12 DIAGNOSIS — M25552 Pain in left hip: Secondary | ICD-10-CM | POA: Diagnosis not present

## 2017-12-14 DIAGNOSIS — M25552 Pain in left hip: Secondary | ICD-10-CM | POA: Diagnosis not present

## 2017-12-18 DIAGNOSIS — M25552 Pain in left hip: Secondary | ICD-10-CM | POA: Diagnosis not present

## 2017-12-25 DIAGNOSIS — M25552 Pain in left hip: Secondary | ICD-10-CM | POA: Diagnosis not present

## 2017-12-27 DIAGNOSIS — M25552 Pain in left hip: Secondary | ICD-10-CM | POA: Diagnosis not present

## 2018-01-01 DIAGNOSIS — M25552 Pain in left hip: Secondary | ICD-10-CM | POA: Diagnosis not present

## 2018-01-03 DIAGNOSIS — M25552 Pain in left hip: Secondary | ICD-10-CM | POA: Diagnosis not present

## 2018-01-18 DIAGNOSIS — L820 Inflamed seborrheic keratosis: Secondary | ICD-10-CM | POA: Diagnosis not present

## 2018-02-01 DIAGNOSIS — Z23 Encounter for immunization: Secondary | ICD-10-CM | POA: Diagnosis not present

## 2018-03-20 DIAGNOSIS — Z1211 Encounter for screening for malignant neoplasm of colon: Secondary | ICD-10-CM | POA: Diagnosis not present

## 2018-03-20 DIAGNOSIS — K641 Second degree hemorrhoids: Secondary | ICD-10-CM | POA: Diagnosis not present

## 2018-03-20 DIAGNOSIS — Z86010 Personal history of colonic polyps: Secondary | ICD-10-CM | POA: Diagnosis not present

## 2018-05-16 ENCOUNTER — Other Ambulatory Visit: Payer: Self-pay | Admitting: Internal Medicine

## 2018-05-16 DIAGNOSIS — Z1231 Encounter for screening mammogram for malignant neoplasm of breast: Secondary | ICD-10-CM

## 2018-05-23 DIAGNOSIS — L820 Inflamed seborrheic keratosis: Secondary | ICD-10-CM | POA: Diagnosis not present

## 2018-06-14 ENCOUNTER — Ambulatory Visit
Admission: RE | Admit: 2018-06-14 | Discharge: 2018-06-14 | Disposition: A | Discharge status: Home / Self Care | Payer: BLUE CROSS/BLUE SHIELD | Source: Ambulatory Visit | Attending: Internal Medicine | Admitting: Internal Medicine

## 2018-06-14 DIAGNOSIS — Z1231 Encounter for screening mammogram for malignant neoplasm of breast: Secondary | ICD-10-CM | POA: Diagnosis not present

## 2018-07-09 DIAGNOSIS — E7849 Other hyperlipidemia: Secondary | ICD-10-CM | POA: Diagnosis not present

## 2018-07-09 DIAGNOSIS — I10 Essential (primary) hypertension: Secondary | ICD-10-CM | POA: Diagnosis not present

## 2018-07-09 DIAGNOSIS — Z0000 Encounter for general adult medical examination without abnormal findings: Secondary | ICD-10-CM | POA: Diagnosis not present

## 2018-07-11 DIAGNOSIS — Z1331 Encounter for screening for depression: Secondary | ICD-10-CM | POA: Diagnosis not present

## 2018-07-11 DIAGNOSIS — H9191 Unspecified hearing loss, right ear: Secondary | ICD-10-CM | POA: Diagnosis not present

## 2018-07-11 DIAGNOSIS — H6121 Impacted cerumen, right ear: Secondary | ICD-10-CM | POA: Diagnosis not present

## 2018-07-11 DIAGNOSIS — L723 Sebaceous cyst: Secondary | ICD-10-CM | POA: Diagnosis not present

## 2018-07-11 DIAGNOSIS — Z0000 Encounter for general adult medical examination without abnormal findings: Secondary | ICD-10-CM | POA: Diagnosis not present

## 2018-07-11 DIAGNOSIS — R809 Proteinuria, unspecified: Secondary | ICD-10-CM | POA: Diagnosis not present

## 2018-10-22 ENCOUNTER — Institutional Professional Consult (permissible substitution): Payer: Self-pay | Admitting: Neurology

## 2018-10-22 ENCOUNTER — Encounter: Payer: Self-pay | Admitting: Neurology

## 2018-10-23 ENCOUNTER — Encounter: Payer: Self-pay | Admitting: Neurology

## 2018-10-23 ENCOUNTER — Ambulatory Visit (INDEPENDENT_AMBULATORY_CARE_PROVIDER_SITE_OTHER): Payer: BLUE CROSS/BLUE SHIELD | Admitting: Neurology

## 2018-10-23 ENCOUNTER — Other Ambulatory Visit: Payer: Self-pay

## 2018-10-23 VITALS — BP 122/84 | HR 86 | Temp 98.7°F | Ht 67.5 in | Wt 201.0 lb

## 2018-10-23 DIAGNOSIS — Z9989 Dependence on other enabling machines and devices: Secondary | ICD-10-CM | POA: Diagnosis not present

## 2018-10-23 DIAGNOSIS — M7062 Trochanteric bursitis, left hip: Secondary | ICD-10-CM

## 2018-10-23 DIAGNOSIS — G4733 Obstructive sleep apnea (adult) (pediatric): Secondary | ICD-10-CM | POA: Diagnosis not present

## 2018-10-23 NOTE — Progress Notes (Signed)
SLEEP MEDICINE CLINIC    Provider:  Melvyn Novasarmen  Thai Hemrick, MD  Primary Care Physician:  Rodrigo RanPerini, Mark, MD [address] [place] [address]     Referring Provider: Rodrigo Ranerini, Mark, Md [address] [place] [id number]          Chief Complaint according to patient   Patient presents with:    . New Patient (Initial Visit)           HISTORY OF PRESENT ILLNESS:  Tracy Atkins is a 58 y.o. year old White or Caucasian female patient seen here as a referral on 10/23/2018 from  Dr. Waynard EdwardsPerini for a sleep evaluation.  Chief concern according to patient :  "I need a new CPAP"   I have the pleasure of seeing Tracy CarolinaMary Ellen Moye today, a right -handed White or Caucasian female with a possible sleep disorder.  She has a  has a past medical history of Anxiety, Depression, GERD (gastroesophageal reflux disease), History of dizziness, Hypertension, PONV (postoperative nausea and vomiting), Sebaceous cyst (06/14/2017), and Sleep apnea.   The patient had the first sleep study in the year 2013 at  [facility] and strted on CPAP. I have the opportunity to look at Mrs. Lysbeth PennerMary Woods compliance download from her current CPAP which is an S9 AutoSet to probably around 58 years old.  She has been 100% compliant user for the last 30 days preceding 22 October 2018 with an average user time of 7 hours 53 minutes each night.  Set pressure 11 cmH2O with 3 cm EPR, residual apnea index is 3.8 and interestingly there are more central than obstructive apneas among the residual events.  I think is definitely worthwhile to look at a new baseline and re-titration.  Sleep relevant medical history: Tonsillectomy in childhood.   Family medical /sleep history: O  other family member on CPAP with OSA. Father is alove at 59 year of age , lives independent. Mother passed away at 3187, was a patient of Dr.Perini  at 4887, CHF. One sister without major medical problems.    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 and lives in a household with 3 persons total- daughter home from college. Family status is married , with one child, no grandchildren.  The patient currently works part time, as a Manufacturing engineerfestive designer.  Pets : 1 mini poodle . Tobacco use: quit 2012.   ETOH use socially- Caffeine intake in form of Coffee( 3 /d) Soda(none ) Tea ( green - at home ) or energy drinks. No regular exercise .  Hobbies : crafting.      Sleep habits are as follows: The patient's dinner time is between 6- 6.30 PM. The patient goes to bed at 10-11 PM, initiated sleep with trazodone ,  continues to sleep for 7 hours, no bathroom breaks. Only with CPAP can she sleep that deep.  The preferred sleep position is left side , with the support of 1 pillows. The bedroom is quiet, dark and cool. Dreams are reportedly rare/.  The patient wakes up spontaneously at 8-8.30 without an alarm. 8.45 AM is the usual rise time.   She reports  feeling refreshed or restored in AM, without symptoms such as dry mouth, morning headaches Naps are not taken .    Review of Systems: Out of a complete 14 system review, the patient complains of only the following symptoms, and all other reviewed systems are negative.:  Fatigue, sleepiness , snoring, fragmented sleep, Insomnia on trazodone    How likely are you to doze in the following situations: 0 = not likely, 1 = slight chance, 2 = moderate chance, 3 = high chance   Sitting and Reading? Watching Television? Sitting inactive in a public place (theater or meeting)? As a passenger in a car for an hour without a break? Lying down in the afternoon when circumstances permit? Sitting and talking to someone? Sitting quietly after lunch without alcohol? In a car, while stopped for a few minutes in traffic?   Total = 9/ 24 points   FSS endorsed at 24/ 63 points.  Geriatric Depression Score  3/ 15   Social History   Socioeconomic History  . Marital status:  Married    Spouse name: Not on file  . Number of children: Not on file  . Years of education: Not on file  . Highest education level: Not on file  Occupational History  . Not on file  Social Needs  . Financial resource strain: Not on file  . Food insecurity    Worry: Not on file    Inability: Not on file  . Transportation needs    Medical: Not on file    Non-medical: Not on file  Tobacco Use  . Smoking status: Former Smoker    Quit date: 2002    Years since quitting: 18.5  . Smokeless tobacco: Never Used  Substance and Sexual Activity  . Alcohol use: Yes    Comment: social  . Drug use: No  . Sexual activity: Not on file  Lifestyle  . Physical activity    Days per week: Not on file    Minutes per session: Not on file  . Stress: Not on file  Relationships  . Social Musicianconnections    Talks on phone: Not on file    Gets together: Not on file    Attends religious service: Not on file    Active member of club or organization: Not on file    Attends meetings of clubs or organizations: Not on file    Relationship status: Not on file  Other Topics Concern  . Not on file  Social History Narrative  . Not on file    Family History  Problem Relation Age of Onset  . Breast cancer Neg Hx     Past Medical History:  Diagnosis Date  . Anxiety   . Depression   . GERD (gastroesophageal reflux disease)   . History of dizziness   . Hypertension   . PONV (postoperative nausea and vomiting)   . Sebaceous cyst 06/14/2017   neck, currently draining  . Sleep apnea     Past Surgical History:  Procedure Laterality Date  . COLONOSCOPY    . EXCISION/RELEASE BURSA HIP Left 06/19/2017   Procedure: Left hip open bursectomy with  gluteal tendon repair;  Surgeon: Durene Romanslin, Matthew, MD;  Location: [facility];  Service: Orthopedics;  Laterality: Left;  60 mins  . UPPER GI ENDOSCOPY    . WRIST SURGERY       Current Outpatient Medications on File Prior to Visit  Medication Sig Dispense Refill  .  Biotin 2500 MCG CAPS Take 2,500 mcg by mouth daily.    . Coenzyme Q10-Fish Oil-Vit E (CO-Q 10 OMEGA-3 FISH OIL PO) Take 1 capsule by mouth daily.    Marland Kitchen. escitalopram (LEXAPRO) 20 MG tablet Take 20 mg by mouth daily.    Marland Kitchen. lisinopril (PRINIVIL,ZESTRIL) 5 MG tablet  Take 5 mg by mouth daily.    . Magnesium 250 MG TABS Take 250 mg by mouth daily.    . Multiple Vitamin (MULTIVITAMIN WITH MINERALS) TABS tablet Take 1 tablet by mouth daily.    . rosuvastatin (CRESTOR) 20 MG tablet Take 20 mg by mouth daily.    . traZODone (DESYREL) 100 MG tablet Take 100 mg by mouth at bedtime.     No current facility-administered medications on file prior to visit.     Allergies  Allergen Reactions  . Sulfa Antibiotics Anaphylaxis    Physical exam:  There were no vitals filed for this visit. There is no height or weight on file to calculate BMI.   Wt Readings from Last 3 Encounters:  06/19/17 199 lb (90.3 kg)  06/14/17 199 lb (90.3 kg)     Ht Readings from Last 3 Encounters:  06/19/17 5\' 7"  (1.702 m)  06/14/17 5\' 7"  (1.702 m)      General: The patient is awake, alert and appears not in acute distress. The patient is well groomed. Head: Normocephalic, atraumatic. Neck is supple. Mallampati 3  neck circumference:15.5 '  inches . Nasal airflow patent. She has CPAP rhinitis- congestion.  Retrognathia is not seen.  Dental status:  Cardiovascular:  Regular rate and cardiac rhythm by pulse,  without distended neck veins. Respiratory: Lungs are clear to auscultation.  Skin:  Without evidence of ankle edema, or rash. Trunk: The patient's posture is erect.   Neurologic exam : The patient is awake and alert, oriented to place and time.   Memory subjective described as intact.  Attention span & concentration ability appears normal.  Speech is fluent,  without  dysarthria, dysphonia or aphasia.  Mood and affect are appropriate.   Cranial nerves: no loss of smell or taste reported  Pupils are equal and  briskly reactive to light. Funduscopic exam deferred.  Extraocular movements in vertical and horizontal planes were intact and without nystagmus. No Diplopia. Visual fields by finger perimetry are intact. Hearing was intact to soft voice and finger rubbing.    Facial sensation intact to fine touch.  Facial motor strength is symmetric and tongue and uvula move midline.  Neck ROM : rotation, tilt and flexion extension were normal for age and shoulder shrug was symmetrical.    Motor exam:  Symmetric bulk, tone and ROM.   Normal tone without cog wheeling, symmetric grip strength .   Sensory:  Fine touch, pinprick and vibration were  normal.  Proprioception tested in the upper extremities was normal.   Coordination: Rapid alternating movements in the fingers/hands were of normal speed.  The Finger-to-nose maneuver was intact without evidence of ataxia, dysmetria or tremor.   Gait and station: Patient could rise unassisted from a seated position, walked without assistive device.  She has residual bursitis left hip symptoms, and feels her hip insistablity.  Stance is of normal width/ base and the patient turned with 3  steps.  Toe and heel walk were deferred.  Deep tendon reflexes: in the  upper and lower extremities are symmetric and intact.  Babinski response was deferred.      After spending a total time of  30  minutes face to face and additional time for physical and neurologic examination, review of laboratory studies,  personal review of imaging studies, reports and results of other testing and review of referral information / records as far as provided in visit, I have established the following assessments:  1)  OSA well  established and current compliant CPAP user, needs new machine, will offer a HST for new  Baseline to be established, and I also order a SPLIT study in case [employer] prefers this.    2) rhinitis improved under CPAP - humidity dependent and not allergic.   3)  Wide based  gait, residual for hip bursitis.    My Plan is to proceed with:  1) HST versus PSG SPLIT to establish  OSA and get a new CPAP machine  to the patient.    I will follow personally in 2-3 month .   I would like to thank Rodrigo RanPerini, Mark, MD and Rodrigo RanPerini, Mark, MD [address] [place],  [place] [address] for allowing me to meet with and to take care of this pleasant patient.   Electronically signed by: Melvyn Novasarmen Shanah Guimaraes, MD 10/23/2018 3:18 PM  [facility] and [facility] Board certified by The [employer] of Sleep Medicine and Diplomate of the [employer] of [employer] Medicine. Board certified In Neurology through the ABPN, Fellow of the [employer] of Neurology. Medical Director of [facility].

## 2018-10-24 DIAGNOSIS — G4733 Obstructive sleep apnea (adult) (pediatric): Secondary | ICD-10-CM | POA: Diagnosis not present

## 2018-10-24 DIAGNOSIS — R7301 Impaired fasting glucose: Secondary | ICD-10-CM | POA: Diagnosis not present

## 2018-10-24 DIAGNOSIS — E669 Obesity, unspecified: Secondary | ICD-10-CM | POA: Diagnosis not present

## 2018-10-24 DIAGNOSIS — I10 Essential (primary) hypertension: Secondary | ICD-10-CM | POA: Diagnosis not present

## 2018-11-05 DIAGNOSIS — H353131 Nonexudative age-related macular degeneration, bilateral, early dry stage: Secondary | ICD-10-CM | POA: Diagnosis not present

## 2018-11-05 DIAGNOSIS — H524 Presbyopia: Secondary | ICD-10-CM | POA: Diagnosis not present

## 2018-11-26 ENCOUNTER — Ambulatory Visit (INDEPENDENT_AMBULATORY_CARE_PROVIDER_SITE_OTHER): Payer: BLUE CROSS/BLUE SHIELD | Admitting: Neurology

## 2018-11-26 DIAGNOSIS — G4733 Obstructive sleep apnea (adult) (pediatric): Secondary | ICD-10-CM

## 2018-11-26 DIAGNOSIS — M7062 Trochanteric bursitis, left hip: Secondary | ICD-10-CM

## 2018-12-06 ENCOUNTER — Telehealth: Payer: Self-pay | Admitting: *Deleted

## 2018-12-06 NOTE — Procedures (Signed)
Patient Information     First Name: Tracy Atkins Last Name: Marshell LevanSmith ID: [id number]  Birth Date: May 29, 1960 Age: [age] Gender: Female  Referring Provider: Rodrigo RanPerini, Mark, MD BMI: 31.5 (W=200 lb, H=5' 7'')  Neck Circ.:  15 '' Epworth:  9/24   Sleep Study Information    Study Date: Nov 26, 2018 S/H/A Version: 5.1.77.7 / 4.1.1528 / 77  History:    Tracy Atkins is a 58 y.o. year old Caucasian female patient seen 10/23/2018 upon referral from Dr. Waynard EdwardsPerini for a sleep evaluation. Chief concern according to patient:  "I need a new CPAP" Tracy CarolinaMary Atkins Cohill today a Caucasian female with a diagnosed sleep disorder.  She has a past medical history of Anxiety, Depression, GERD (gastroesophageal reflux disease), History of dizziness, Hypertension, PONV (postoperative nausea and vomiting), Sebaceous cyst (06/14/2017), and Sleep apnea. The patient had the [facility] the year [facility] and started on CPAP. I have the opportunity to look at Mrs. Tracy Atkins compliance download from her current CPAP which is an S9 AutoSet to probably around 58 years old.  She has been 100% compliant user for the last 30 days preceding 22 October 2018 with an average user time of 7 hours 53 minutes each night.  Set pressure 11 cmH2O with 3 cm EPR, residual apnea index is 3.8 and interestingly there are more central than obstructive apneas among the residual events.  I think is definitely worthwhile to look at a new baseline and re-titration.  Summary & Diagnosis:     The patient's HST still documents OSA with a strong REM sleep accentuation, AHI overall was 24.9/h and in REM sleep was 47.9/h. Intermittent slower heart rates can be related to motion artefact, no tachycardia was recorded, and no  sustained hypoxia found.   Recommendations:      REM dependent apnea requires PAP therapy and I will order a new CPAP machine with auto-titration capability, heated humidity and mask of patient's choice.   The  pressure settings will be 7 through 14 cm water pressure with 2 cm EPR.   Electronically signed:  Melvyn Novasarmen Seini Lannom, MD   12-05-2018           Sleep Summary  Oxygen Saturation Statistics   Start Study Time: End Study Time: Total Recording Time:  11:40:57 PM 8:51:28 AM   9 h, 10 min  Total Sleep Time % REM of Sleep Time:  8 h, 15 min 11.4    Mean: 94 Minimum: 83 Maximum: 99  Mean of Desaturations Nadirs (%):   90  Oxygen Desaturation. %:   4-9 10-20 >20 Total  Events Number Total    90  6 93.8 6.3  0 0.0  96 100.0  Oxygen Saturation: <90 <=88 <85 <80 <70  Duration (minutes): Sleep % 5.4 1.1  2.8 0.1  0.6 0.0 0.0 0.0 0.0 0.0     Respiratory Indices       Total Events REM NREM All Night  pRDI: pAHI: ODI:  225  204  96 51.1 47.9 38.3 24.4 21.9 8.3 27.4 24.9 11.7       Pulse Rate Statistics during Sleep (BPM)      Mean:  57 Minimum: 41 Maximum: 91    Indices are calculated using technically valid sleep time of  8 hrs, 12 min. pRDI/pAHI are calculated using oxi desaturations ? 3%  Body Position Statistics Position Supine Prone Right Left Non-Supine  Sleep (min) 0.0 0.0 0.0 0.0  0.0  Sleep % 0.0 0.0 0.0 0.0 0.0  pRDI N/A N/A N/A N/A N/A  pAHI N/A N/A N/A N/A N/A  ODI N/A N/A N/A N/A N/A  Snoring Statistics Snoring Level (dB) >40 >50 >60 >70 >80 >Threshold (45)  Sleep (min) 43.8 18.0 0.0 0.0 0.0 26.9  Sleep % 8.8 3.6 0.0 0.0 0.0 5.4    Mean: 41 dB

## 2018-12-06 NOTE — Addendum Note (Signed)
Addended by: Larey Seat on: 12/06/2018 12:49 PM   Modules accepted: Orders

## 2018-12-06 NOTE — Telephone Encounter (Signed)
Called patient and informed her that her sleep study still shows sleep apnea especially with REM sleep. She still needs a CPAP, and Dr Brett Fairy has ordered one. She stated it has been many years since she used DME, and she has moved. She has no preference of a DME. I advised her will send order to DME that [place] where she lives. She'll get a call in about a week. We scheduled her CPAP FU in Oct with Leandrew Koyanagi, NP.  She verbalized understanding, appreciation. Community message sent to [facility] re: New CPAP order in.

## 2018-12-11 DIAGNOSIS — D2262 Melanocytic nevi of left upper limb, including shoulder: Secondary | ICD-10-CM | POA: Diagnosis not present

## 2018-12-11 DIAGNOSIS — Z85828 Personal history of other malignant neoplasm of skin: Secondary | ICD-10-CM | POA: Diagnosis not present

## 2018-12-11 DIAGNOSIS — L821 Other seborrheic keratosis: Secondary | ICD-10-CM | POA: Diagnosis not present

## 2018-12-11 DIAGNOSIS — D2261 Melanocytic nevi of right upper limb, including shoulder: Secondary | ICD-10-CM | POA: Diagnosis not present

## 2018-12-19 DIAGNOSIS — G4733 Obstructive sleep apnea (adult) (pediatric): Secondary | ICD-10-CM | POA: Diagnosis not present

## 2019-01-18 DIAGNOSIS — G4733 Obstructive sleep apnea (adult) (pediatric): Secondary | ICD-10-CM | POA: Diagnosis not present

## 2019-01-24 DIAGNOSIS — E669 Obesity, unspecified: Secondary | ICD-10-CM | POA: Diagnosis not present

## 2019-01-24 DIAGNOSIS — R7301 Impaired fasting glucose: Secondary | ICD-10-CM | POA: Diagnosis not present

## 2019-02-04 ENCOUNTER — Ambulatory Visit: Payer: BLUE CROSS/BLUE SHIELD | Admitting: Family Medicine

## 2019-02-04 ENCOUNTER — Encounter: Payer: Self-pay | Admitting: Family Medicine

## 2019-02-04 ENCOUNTER — Other Ambulatory Visit: Payer: Self-pay

## 2019-02-04 ENCOUNTER — Telehealth: Payer: Self-pay | Admitting: Family Medicine

## 2019-02-04 VITALS — BP 129/78 | HR 84 | Temp 97.7°F | Ht 67.5 in | Wt 183.6 lb

## 2019-02-04 DIAGNOSIS — G4733 Obstructive sleep apnea (adult) (pediatric): Secondary | ICD-10-CM | POA: Diagnosis not present

## 2019-02-04 DIAGNOSIS — Z9989 Dependence on other enabling machines and devices: Secondary | ICD-10-CM | POA: Diagnosis not present

## 2019-02-04 NOTE — Progress Notes (Signed)
PATIENT: Tracy Atkins DOB: [birth year]  REASON FOR VISIT: follow up HISTORY FROM: patient  Chief Complaint  Patient presents with  . Follow-up    Initial cpap f/u. Alone. Rm 2. No new concerns at this time.      HISTORY OF PRESENT ILLNESS: Today 02/04/19 Tracy Atkins is a 58 y.o. female here today for follow up of OSA on CPAP. She was on CPAP previously managed by [facility] in [facility]. CPAP was in need of being replaced. HST confirmed continued OSA.  She reports doing very well with her new CPAP machine.  She denies any concerns.  Compliance report dated 01/05/2019 through 02/03/2019 reveals that she has used CPAP 30 out of the last 30 days for compliance 100%.  29 of the last 30 days she used CPAP greater than 4 hours for compliance of 97%.  Average usage was 8 hours and 44 minutes.  AHI was 1.8 on 7 to 14 cm of water and an EPR of 2.  There was no significant leak noted.  HISTORY: (copied from Dr Oliva Bustard note on 10/23/2018)  Tracy Hilger Smithis a 58 y.o. year old White or Caucasian female patientseen here as a referralon 10/23/2018 from  Dr. Marla Roe a sleep evaluation.  Chiefconcernaccording to patient : "I need a new CPAP"  I have the pleasure of seeing Tracy Atkins today,a right -handed White or Caucasian female with a possible sleep disorder. She has a  has a past medical history of Anxiety, Depression, GERD (gastroesophageal reflux disease), History of dizziness, Hypertension, PONV (postoperative nausea and vomiting), Sebaceous cyst (06/14/2017), and Sleep apnea.  The patient had the first sleep study in the year 2013 at  [facility] and strted on CPAP. I have the opportunity to look at Mrs. Tracy Atkins compliance download from her current CPAP which is an S9 AutoSet to probably around 58 years old.  She has been 100% compliant user for the last 30 days preceding 22 October 2018 with an average user time of 7 hours 53 minutes each  night.  Set pressure 11 cmH2O with 3 cm EPR, residual apnea index is 3.8 and interestingly there are more central than obstructive apneas among the residual events.  I think is definitely worthwhile to look at a new baseline and re-titration.  Sleeprelevant medical history: Tonsillectomy in childhood.  Familymedical /sleep history:O  other family member on CPAP with OSA. Father is alove at 30 year of age , lives independent. Mother passed away at 41, was a patient of Dr.Perini  at 75, CHF. One sister without major medical problems.   Social history:Patient is working as self employed-  She went one year to college. and lives in a household with 3 persons total- daughter home from college. Family status is married , with one child, no grandchildren.  The patient currently works part time, as a Manufacturing engineer.  Pets : 1 mini poodle . Tobacco use: quit 2012.  ETOH use socially- Caffeine intake in form of Coffee( 3 /d) Soda(none ) Tea ( green - at home ) or energy drinks. No regular exercise .  Hobbies : crafting.  Sleep habits are as follows: The patient's dinner time is between 6- 6.30 PM. The patient goes to bed at 10-11 PM, initiated sleep with trazodone ,  continues to sleep for 7 hours, no bathroom breaks. Only with CPAP can she sleep that deep.  The preferred sleep position is left side , with the support  of 1 pillows. The bedroom is quiet, dark and cool. Dreams are reportedly rare/.  The patient wakes up spontaneously at 8-8.30 without an alarm. 8.45 AM is the usual rise time.   She reports  feeling refreshed or restored in AM, without symptoms such as dry mouth, morning headaches Naps are not taken .   REVIEW OF SYSTEMS: Out of a complete 14 system review of symptoms, the patient complains only of the following symptoms, none and all other reviewed systems are negative.  Epworth sleepiness scale: 3 Fatigue severity scale: 21  ALLERGIES: Allergies  Allergen Reactions  .  Sulfa Antibiotics Anaphylaxis    HOME MEDICATIONS: Outpatient Medications Prior to Visit  Medication Sig Dispense Refill  . Biotin 2500 MCG CAPS Take 2,500 mcg by mouth daily.    . Coenzyme Q10-Fish Oil-Vit E (CO-Q 10 OMEGA-3 FISH OIL PO) Take 1 capsule by mouth daily.    Marland Kitchen escitalopram (LEXAPRO) 20 MG tablet Take 20 mg by mouth daily.    Marland Kitchen lisinopril (PRINIVIL,ZESTRIL) 5 MG tablet Take 5 mg by mouth daily.    . Magnesium 250 MG TABS Take 250 mg by mouth daily.    . Multiple Vitamin (MULTIVITAMIN WITH MINERALS) TABS tablet Take 1 tablet by mouth daily.    Marland Kitchen omeprazole (PRILOSEC) 20 MG capsule Take 20 mg by mouth daily.    . rosuvastatin (CRESTOR) 20 MG tablet Take 20 mg by mouth daily.    . traZODone (DESYREL) 100 MG tablet Take 100 mg by mouth at bedtime.     No facility-administered medications prior to visit.     PAST MEDICAL HISTORY: Past Medical History:  Diagnosis Date  . Anxiety   . Depression   . GERD (gastroesophageal reflux disease)   . History of dizziness   . Hypertension   . PONV (postoperative nausea and vomiting)   . Sebaceous cyst 06/14/2017   neck, currently draining  . Sleep apnea     PAST SURGICAL HISTORY: Past Surgical History:  Procedure Laterality Date  . COLONOSCOPY    . EXCISION/RELEASE BURSA HIP Left 06/19/2017   Procedure: Left hip open bursectomy with  gluteal tendon repair;  Surgeon: Paralee Cancel, MD;  Location: [facility];  Service: Orthopedics;  Laterality: Left;  60 mins  . UPPER GI ENDOSCOPY    . WRIST SURGERY      FAMILY HISTORY: Family History  Problem Relation Age of Onset  . Breast cancer Neg Hx     SOCIAL HISTORY: Social History   Socioeconomic History  . Marital status: Married    Spouse name: Not on file  . Number of children: Not on file  . Years of education: Not on file  . Highest education level: Not on file  Occupational History  . Not on file  Social Needs  . Financial resource strain: Not on file  . Food  insecurity    Worry: Not on file    Inability: Not on file  . Transportation needs    Medical: Not on file    Non-medical: Not on file  Tobacco Use  . Smoking status: Former Smoker    Quit date: 2002    Years since quitting: 18.8  . Smokeless tobacco: Never Used  Substance and Sexual Activity  . Alcohol use: Yes    Comment: social  . Drug use: No  . Sexual activity: Not on file  Lifestyle  . Physical activity    Days per week: Not on file    Minutes per session:  Not on file  . Stress: Not on file  Relationships  . Social Musicianconnections    Talks on phone: Not on file    Gets together: Not on file    Attends religious service: Not on file    Active member of club or organization: Not on file    Attends meetings of clubs or organizations: Not on file    Relationship status: Not on file  . Intimate partner violence    Fear of current or ex partner: Not on file    Emotionally abused: Not on file    Physically abused: Not on file    Forced sexual activity: Not on file  Other Topics Concern  . Not on file  Social History Narrative  . Not on file      PHYSICAL EXAM  Vitals:   02/04/19 1321  BP: 129/78  Pulse: 84  Temp: 97.7 F (36.5 C)  TempSrc: Oral  Weight: 183 lb 9.6 oz (83.3 kg)  Height: 5' 7.5" (1.715 m)   Body mass index is 28.33 kg/m.  Generalized: Well developed, in no acute distress  Cardiology: normal rate and rhythm, no murmur noted Respiratory: Clear to auscultation bilaterally Neurological examination  Mentation: Alert oriented to time, place, history taking. Follows all commands speech and language fluent Cranial nerve II-XII: Pupils were equal round reactive to light. Extraocular movements were full, visual field were full on confrontational test.  Motor: The motor testing reveals 5 over 5 strength of all 4 extremities. Good symmetric motor tone is noted throughout.  Gait and station: Gait is normal.   DIAGNOSTIC DATA (LABS, IMAGING, TESTING) - I  reviewed patient records, labs, notes, testing and imaging myself where available.  No flowsheet data found.   Lab Results  Component Value Date   WBC 10.0 06/20/2017   HGB 12.2 06/20/2017   HCT 37.3 06/20/2017   MCV 93.7 06/20/2017   PLT 270 06/20/2017      Component Value Date/Time   NA 141 06/20/2017 0553   K 4.3 06/20/2017 0553   CL 104 06/20/2017 0553   CO2 27 06/20/2017 0553   GLUCOSE 102 (H) 06/20/2017 0553   BUN 12 06/20/2017 0553   CREATININE 0.74 06/20/2017 0553   CALCIUM 9.2 06/20/2017 0553   PROT 7.6 06/04/2013 1111   ALBUMIN 4.4 06/04/2013 1111   AST 20 06/04/2013 1111   ALT 20 06/04/2013 1111   ALKPHOS 77 06/04/2013 1111   BILITOT 0.5 06/04/2013 1111   GFRNONAA >60 06/20/2017 0553   GFRAA >60 06/20/2017 0553   No results found for: CHOL, HDL, LDLCALC, LDLDIRECT, TRIG, CHOLHDL No results found for: [id number] No results found for: VITAMINB12 No results found for: TSH     ASSESSMENT AND PLAN 58 y.o. year old female  has a past medical history of Anxiety, Depression, GERD (gastroesophageal reflux disease), History of dizziness, Hypertension, PONV (postoperative nausea and vomiting), Sebaceous cyst (06/14/2017), and Sleep apnea. here with     ICD-10-CM   1. OSA on CPAP  G47.33    Z99.89     Mrs. Katrinka BlazingSmith is doing very well with CPAP therapy.  Compliance report reveals excellent compliance.  She was encouraged to continue using CPAP nightly and for greater than 4 hours each night.  We will place order today for new supplies.  She will follow-up with me annually, sooner if needed.  She verbalizes understanding and agreement with this plan.   No orders of the defined types were placed in this encounter.  No orders of the defined types were placed in this encounter.     I spent 15 minutes with the patient. 50% of this time was spent counseling and educating patient on plan of care and medications.    Shawnie Dapper, FNP-C 02/04/2019, 1:30 PM [facility] [address] [place], [place] [address] [phone]

## 2019-02-04 NOTE — Patient Instructions (Signed)
Continue CPAP nightly and for greater than 4 hours each night  Follow up in 1 year  Sleep Apnea Sleep apnea affects breathing during sleep. It causes breathing to stop for a short time or to become shallow. It can also increase the risk of:  Heart attack.  Stroke.  Being very overweight (obese).  Diabetes.  Heart failure.  Irregular heartbeat. The goal of treatment is to help you breathe normally again. What are the causes? There are three kinds of sleep apnea:  Obstructive sleep apnea. This is caused by a blocked or collapsed airway.  Central sleep apnea. This happens when the brain does not send the right signals to the muscles that control breathing.  Mixed sleep apnea. This is a combination of obstructive and central sleep apnea. The most common cause of this condition is a collapsed or blocked airway. This can happen if:  Your throat muscles are too relaxed.  Your tongue and tonsils are too large.  You are overweight.  Your airway is too small. What increases the risk?  Being overweight.  Smoking.  Having a small airway.  Being older.  Being female.  Drinking alcohol.  Taking medicines to calm yourself (sedatives or tranquilizers).  Having family members with the condition. What are the signs or symptoms?  Trouble staying asleep.  Being sleepy or tired during the day.  Getting angry a lot.  Loud snoring.  Headaches in the morning.  Not being able to focus your mind (concentrate).  Forgetting things.  Less interest in sex.  Mood swings.  Personality changes.  Feelings of sadness (depression).  Waking up a lot during the night to pee (urinate).  Dry mouth.  Sore throat. How is this diagnosed?  Your medical history.  A physical exam.  A test that is done when you are sleeping (sleep study). The test is most often done in a sleep lab but may also be done at home. How is this treated?   Sleeping on your side.  Using a  medicine to get rid of mucus in your nose (decongestant).  Avoiding the use of alcohol, medicines to help you relax, or certain pain medicines (narcotics).  Losing weight, if needed.  Changing your diet.  Not smoking.  Using a machine to open your airway while you sleep, such as: ? An oral appliance. This is a mouthpiece that shifts your lower jaw forward. ? A CPAP device. This device blows air through a mask when you breathe out (exhale). ? An EPAP device. This has valves that you put in each nostril. ? A BPAP device. This device blows air through a mask when you breathe in (inhale) and breathe out.  Having surgery if other treatments do not work. It is important to get treatment for sleep apnea. Without treatment, it can lead to:  High blood pressure.  Coronary artery disease.  In men, not being able to have an erection (impotence).  Reduced thinking ability. Follow these instructions at home: Lifestyle  Make changes that your doctor recommends.  Eat a healthy diet.  Lose weight if needed.  Avoid alcohol, medicines to help you relax, and some pain medicines.  Do not use any products that contain nicotine or tobacco, such as cigarettes, e-cigarettes, and chewing tobacco. If you need help quitting, ask your doctor. General instructions  Take over-the-counter and prescription medicines only as told by your doctor.  If you were given a machine to use while you sleep, use it only as told by your   doctor.  If you are having surgery, make sure to tell your doctor you have sleep apnea. You may need to bring your device with you.  Keep all follow-up visits as told by your doctor. This is important. Contact a doctor if:  The machine that you were given to use during sleep bothers you or does not seem to be working.  You do not get better.  You get worse. Get help right away if:  Your chest hurts.  You have trouble breathing in enough air.  You have an uncomfortable  feeling in your back, arms, or stomach.  You have trouble talking.  One side of your body feels weak.  A part of your face is hanging down. These symptoms may be an emergency. Do not wait to see if the symptoms will go away. Get medical help right away. Call your local emergency services (911 in the [place] Do not drive yourself to the hospital. Summary  This condition affects breathing during sleep.  The most common cause is a collapsed or blocked airway.  The goal of treatment is to help you breathe normally while you sleep. This information is not intended to replace advice given to you by your health care provider. Make sure you discuss any questions you have with your health care provider. Document Released: 01/05/2008 Document Revised: 01/12/2018 Document Reviewed: 11/21/2017 Elsevier Patient Education  2020 [employer] Inc[employer]  

## 2019-02-04 NOTE — Telephone Encounter (Signed)
Patient was given 1 year follow-up after today's visit. Patient states she will call to make this appt.

## 2019-02-18 DIAGNOSIS — G4733 Obstructive sleep apnea (adult) (pediatric): Secondary | ICD-10-CM | POA: Diagnosis not present

## 2019-03-04 IMAGING — MG 2D DIGITAL SCREENING BILATERAL MAMMOGRAM WITH 3D TOMO WITH CAD
8 of 12 series · 8 of 28 positions shown · non-contrast
Comparison: Previous exam(s).

CLINICAL DATA: Screening.

EXAM:
2D DIGITAL SCREENING BILATERAL MAMMOGRAM WITH 3D TOMO WITH CAD

[L MLO synth-2D]
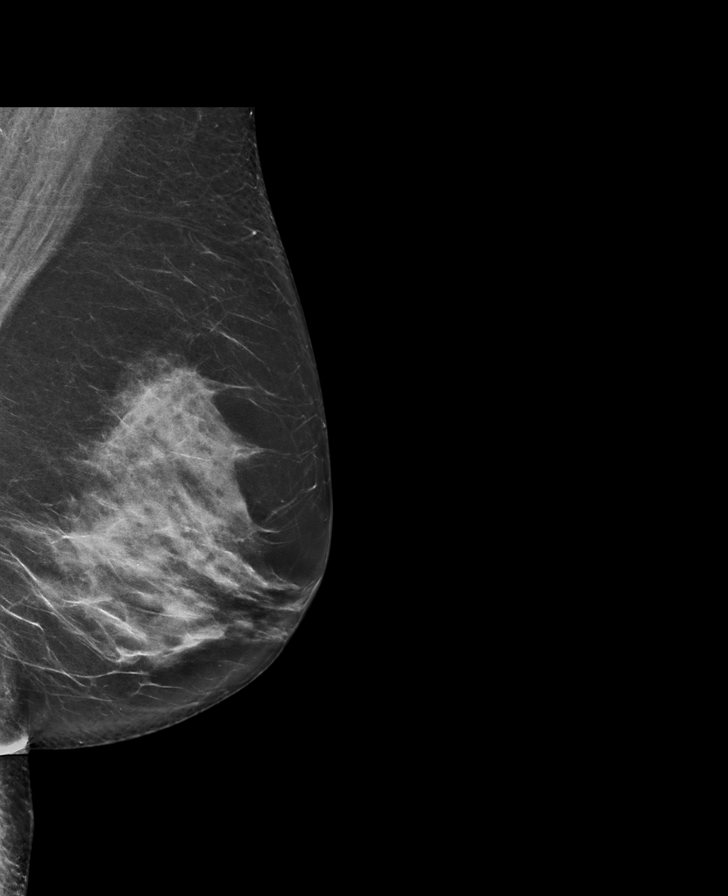

[R CC synth-2D]
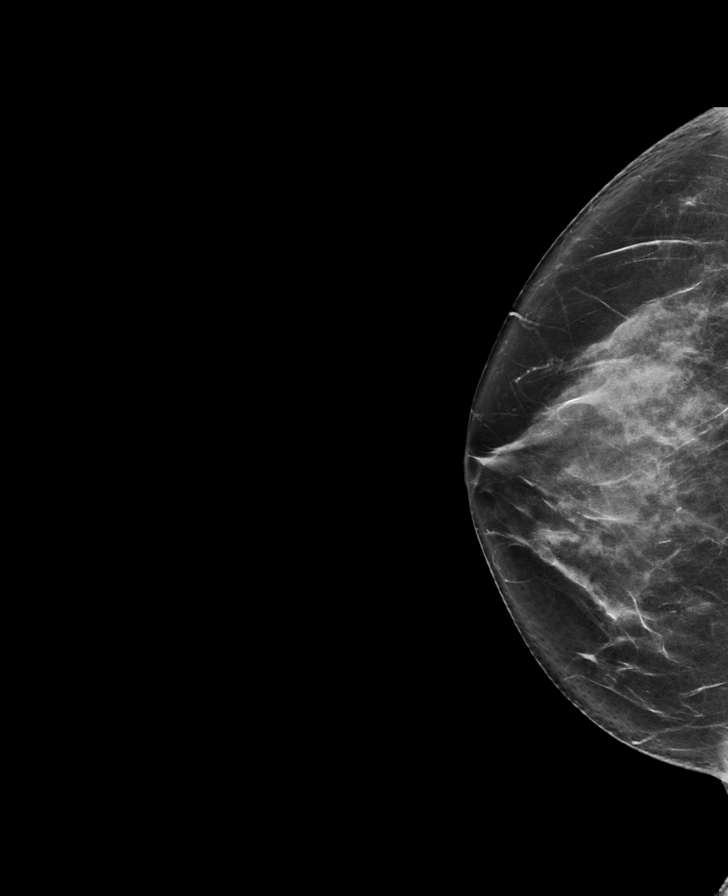

[L CC]
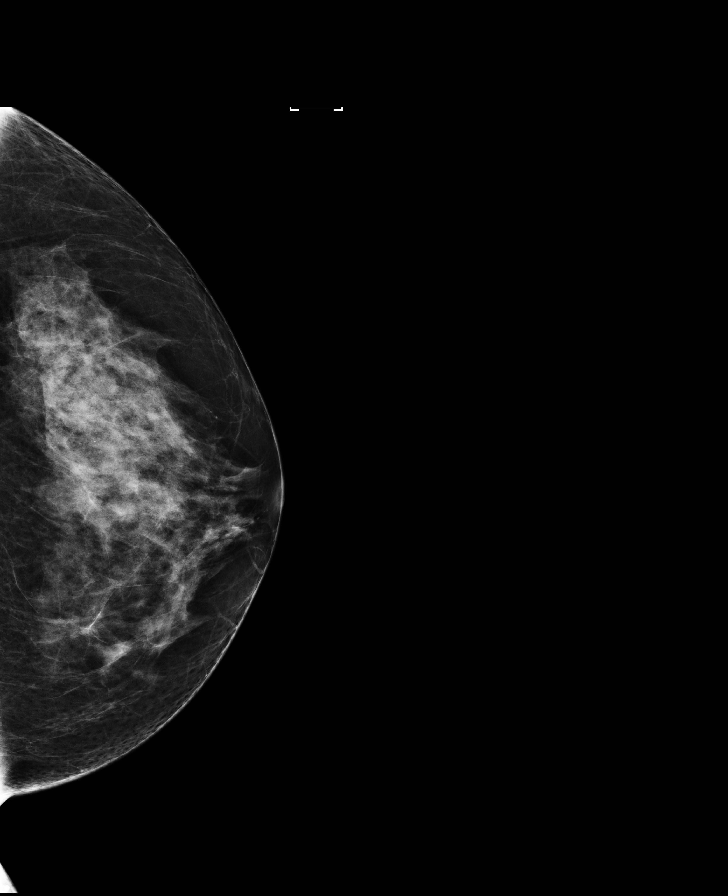

[R MLO synth-2D]
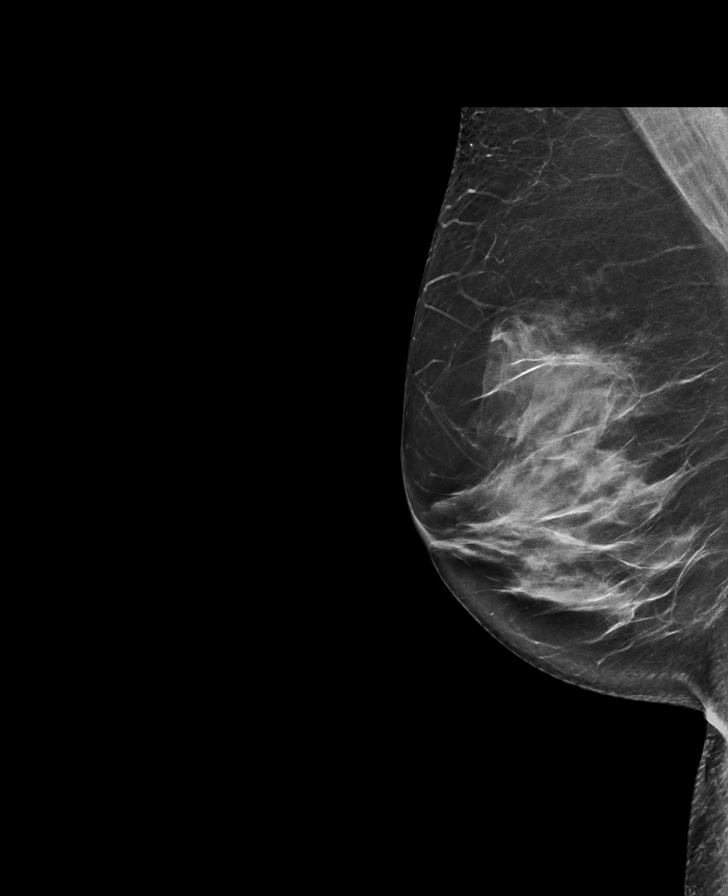

[R MLO]
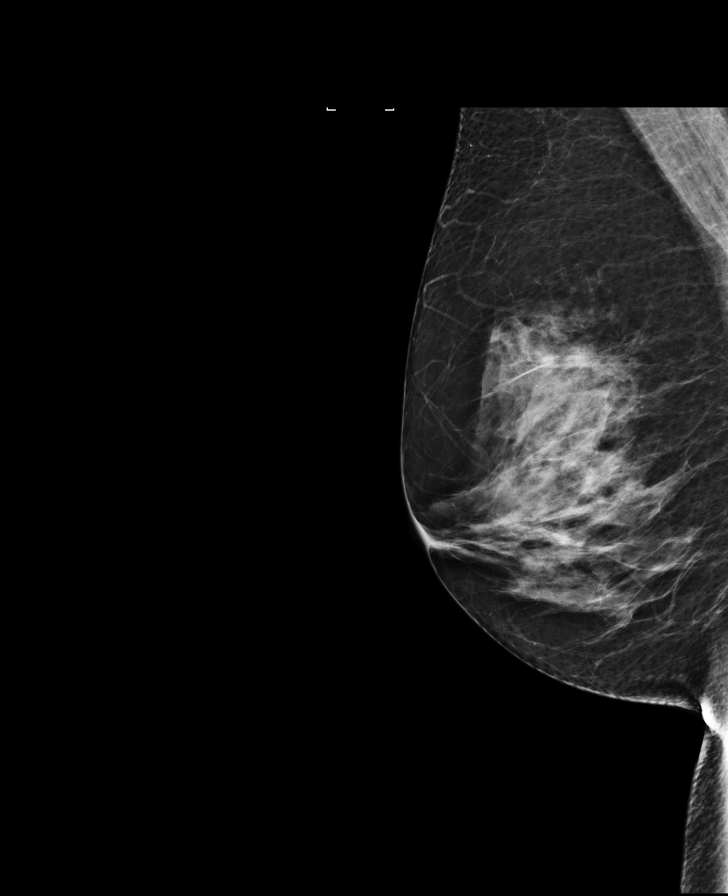

[L CC synth-2D]
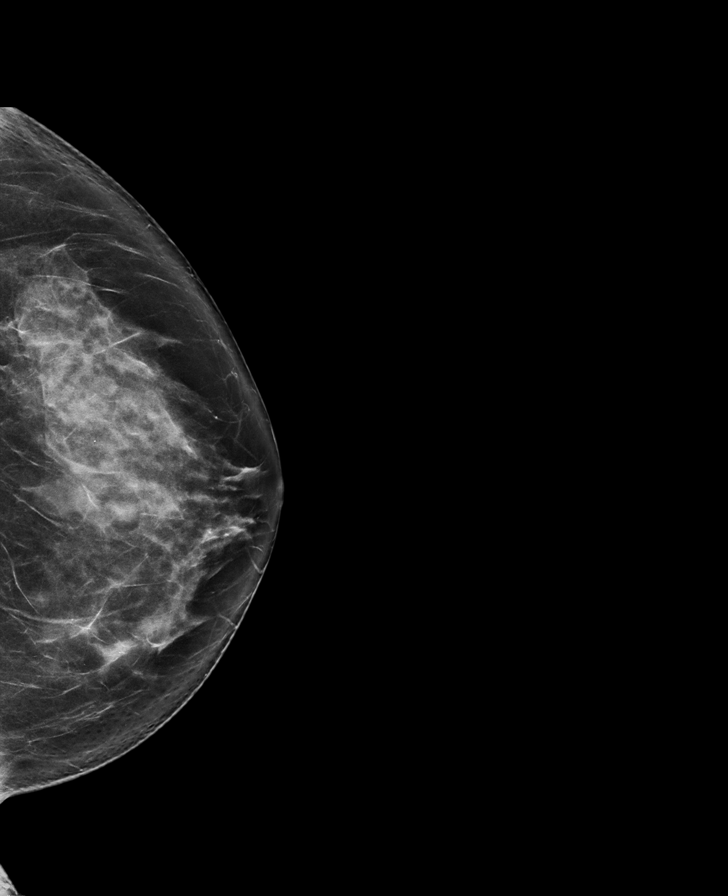

[R CC]
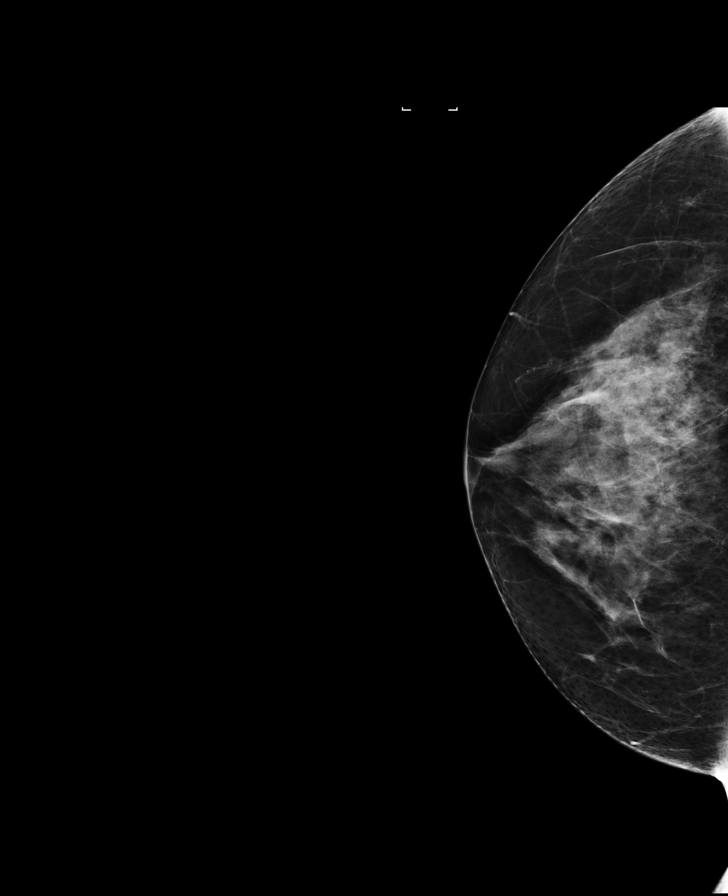

[L MLO]
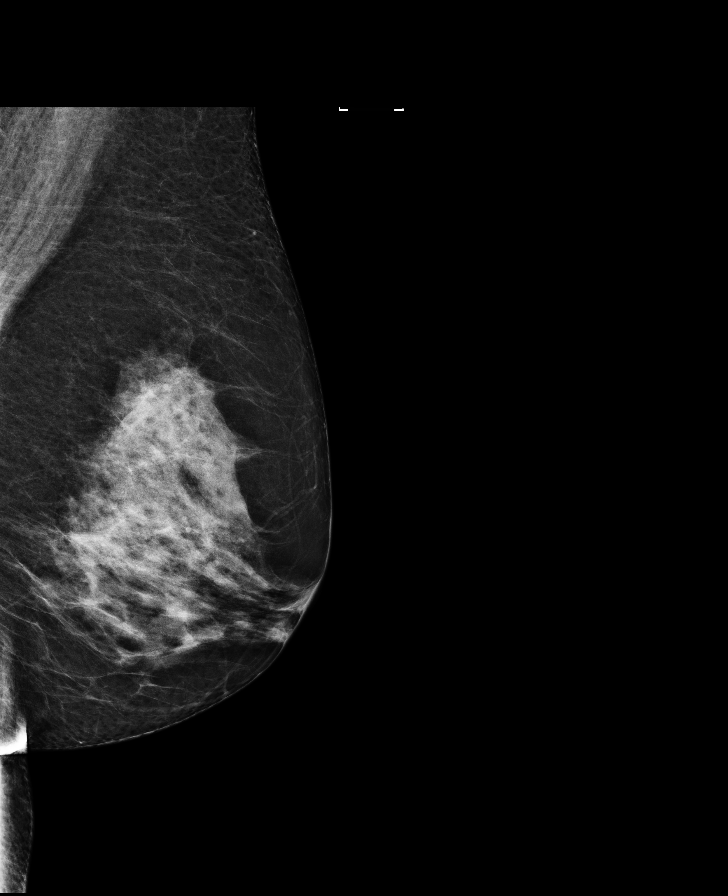

[8 of 28 positions shown; findings below may reference images not displayed]

ACR Breast Density Category d: The breast tissue is extremely dense,
which lowers the sensitivity of mammography.
FINDINGS: There are no findings suspicious for malignancy. Images were
processed with CAD.
IMPRESSION: No mammographic evidence of malignancy. A result letter of this
screening mammogram will be mailed directly to the patient.

RECOMMENDATION:
Screening mammogram in one year. (Code:L7-0-D4H)

BI-RADS CATEGORY  1: Negative.

## 2019-03-20 DIAGNOSIS — G4733 Obstructive sleep apnea (adult) (pediatric): Secondary | ICD-10-CM | POA: Diagnosis not present

## 2019-04-22 DIAGNOSIS — G4733 Obstructive sleep apnea (adult) (pediatric): Secondary | ICD-10-CM | POA: Diagnosis not present

## 2019-06-07 ENCOUNTER — Other Ambulatory Visit: Payer: Self-pay | Admitting: Internal Medicine

## 2019-06-07 DIAGNOSIS — Z1231 Encounter for screening mammogram for malignant neoplasm of breast: Secondary | ICD-10-CM

## 2019-07-09 ENCOUNTER — Ambulatory Visit
Admission: RE | Admit: 2019-07-09 | Discharge: 2019-07-09 | Disposition: A | Discharge status: Home / Self Care | Payer: BC Managed Care – PPO | Source: Ambulatory Visit | Attending: Internal Medicine | Admitting: Internal Medicine

## 2019-07-09 ENCOUNTER — Other Ambulatory Visit: Payer: Self-pay

## 2019-07-09 DIAGNOSIS — Z1231 Encounter for screening mammogram for malignant neoplasm of breast: Secondary | ICD-10-CM

## 2019-07-23 DIAGNOSIS — R7301 Impaired fasting glucose: Secondary | ICD-10-CM | POA: Diagnosis not present

## 2019-07-23 DIAGNOSIS — Z0000 Encounter for general adult medical examination without abnormal findings: Secondary | ICD-10-CM | POA: Diagnosis not present

## 2019-07-23 DIAGNOSIS — E7849 Other hyperlipidemia: Secondary | ICD-10-CM | POA: Diagnosis not present

## 2019-07-26 DIAGNOSIS — I10 Essential (primary) hypertension: Secondary | ICD-10-CM | POA: Diagnosis not present

## 2019-07-26 DIAGNOSIS — E669 Obesity, unspecified: Secondary | ICD-10-CM | POA: Diagnosis not present

## 2019-07-26 DIAGNOSIS — Z1331 Encounter for screening for depression: Secondary | ICD-10-CM | POA: Diagnosis not present

## 2019-07-26 DIAGNOSIS — R82998 Other abnormal findings in urine: Secondary | ICD-10-CM | POA: Diagnosis not present

## 2019-07-26 DIAGNOSIS — Z0000 Encounter for general adult medical examination without abnormal findings: Secondary | ICD-10-CM | POA: Diagnosis not present

## 2019-07-26 DIAGNOSIS — R809 Proteinuria, unspecified: Secondary | ICD-10-CM | POA: Diagnosis not present

## 2019-07-26 DIAGNOSIS — R7301 Impaired fasting glucose: Secondary | ICD-10-CM | POA: Diagnosis not present

## 2019-11-07 DIAGNOSIS — H353131 Nonexudative age-related macular degeneration, bilateral, early dry stage: Secondary | ICD-10-CM | POA: Diagnosis not present

## 2020-02-14 DIAGNOSIS — Z20822 Contact with and (suspected) exposure to covid-19: Secondary | ICD-10-CM | POA: Diagnosis not present

## 2020-03-03 DIAGNOSIS — D1801 Hemangioma of skin and subcutaneous tissue: Secondary | ICD-10-CM | POA: Diagnosis not present

## 2020-03-03 DIAGNOSIS — Z85828 Personal history of other malignant neoplasm of skin: Secondary | ICD-10-CM | POA: Diagnosis not present

## 2020-03-03 DIAGNOSIS — L814 Other melanin hyperpigmentation: Secondary | ICD-10-CM | POA: Diagnosis not present

## 2020-03-03 DIAGNOSIS — L821 Other seborrheic keratosis: Secondary | ICD-10-CM | POA: Diagnosis not present

## 2020-03-27 DIAGNOSIS — R509 Fever, unspecified: Secondary | ICD-10-CM | POA: Diagnosis not present

## 2020-03-27 DIAGNOSIS — R059 Cough, unspecified: Secondary | ICD-10-CM | POA: Diagnosis not present

## 2020-06-04 ENCOUNTER — Other Ambulatory Visit: Payer: Self-pay | Admitting: Internal Medicine

## 2020-06-04 DIAGNOSIS — Z1231 Encounter for screening mammogram for malignant neoplasm of breast: Secondary | ICD-10-CM

## 2020-07-24 ENCOUNTER — Other Ambulatory Visit: Payer: Self-pay

## 2020-07-24 ENCOUNTER — Ambulatory Visit
Admission: RE | Admit: 2020-07-24 | Discharge: 2020-07-24 | Disposition: A | Discharge status: Home / Self Care | Payer: Medicare Other | Source: Ambulatory Visit | Attending: Internal Medicine | Admitting: Internal Medicine

## 2020-07-24 DIAGNOSIS — Z1231 Encounter for screening mammogram for malignant neoplasm of breast: Secondary | ICD-10-CM

## 2021-05-19 NOTE — Progress Notes (Signed)
Cardiology Office Note:    Date:  05/20/2021   ID:  Tracy Atkins, DOB 29-Oct-1960, MRN [id number]  PCP:  Tracy Infante, MD   [facility] Providers Cardiologist:  Tracy Furbish, MD     Referring MD: Tracy Infante, MD   History of Present Illness:    Tracy Atkins is a 61 y.o. female here for the evaluation of palpitations at the request of Dr. Joylene Atkins.  04/26/2021 Notes from Dr. Joylene Atkins reviewed. For three weeks prior her heart fluttering was more frequent and causes her to cough and sometimes to take a brief breath. Her palpitations last for several seconds. She also reported that these episodes occurred years ago, and she would have to cough to get it back in rhythm. Magnesium supplement was tried previously. She was referred to cardiology for evaluation of palpitations and heart fluttering, possible need for monitor/echo. Benign etiology was suspected.  Today: Overall, she appears well. She reports her palpitations have been intermittently episodic for years. They occur randomly at all hours of the day, and sometimes wake her up at night. Her palpitations also cause her to cough, which helps resolve her symptoms. Magnesium also seems to help her. Once in a while she has an associated sensation similar to a cough/gasping for breath that lasts only a short moment. Of note, she states that her sister has the same issues with palpitations..  She was told she had some plaque on a [employer] screening.  In her diet, she has stopped drinking sodas. She does not smoke.  She denies any chest pain. No lightheadedness, headaches, syncope, orthopnea, PND, lower extremity edema or exertional symptoms.   Past Medical History:  Diagnosis Date   Anxiety    Bursitis of both hips    Colon polyps    Degeneration macular    Depression    Fluttering heart    GERD (gastroesophageal reflux disease)    History of dizziness    Hyperlipidemia    Hypertension    Microalbuminuria    Obesity     Osteoporosis of lumbar spine    Palpitations    PONV (postoperative nausea and vomiting)    Sebaceous cyst 06/14/2017   neck, currently draining   Sleep apnea    Urine incontinence     Past Surgical History:  Procedure Laterality Date   COLONOSCOPY     EXCISION/RELEASE BURSA HIP Left 06/19/2017   Procedure: Left hip open bursectomy with  gluteal tendon repair;  Surgeon: Paralee Cancel, MD;  Location: [facility];  Service: Orthopedics;  Laterality: Left;  60 mins   UPPER GI ENDOSCOPY     WRIST SURGERY      Current Medications: Current Meds  Medication Sig   escitalopram (LEXAPRO) 20 MG tablet Take 20 mg by mouth daily.   lisinopril (ZESTRIL) 10 MG tablet Take 1 tablet by mouth every evening.   Magnesium 250 MG TABS Take 250 mg by mouth daily.   Multiple Vitamin (MULTIVITAMIN WITH MINERALS) TABS tablet Take 1 tablet by mouth daily.   rosuvastatin (CRESTOR) 20 MG tablet Take 20 mg by mouth daily.   Semaglutide,0.25 or 0.5MG /DOS, (OZEMPIC, 0.25 OR 0.5 MG/DOSE,) 2 MG/1.5ML SOPN Inject into the skin once a week.   traZODone (DESYREL) 100 MG tablet Take 100 mg by mouth at bedtime.     Allergies:   Sulfa antibiotics   Social History   Socioeconomic History   Marital status: Married    Spouse name: Not on file   Number of children:  Not on file   Years of education: Not on file   Highest education level: Not on file  Occupational History   Not on file  Tobacco Use   Smoking status: Former    Types: Cigarettes    Quit date: 2002    Years since quitting: 21.1   Smokeless tobacco: Never  Vaping Use   Vaping Use: Never used  Substance and Sexual Activity   Alcohol use: Yes    Comment: social   Drug use: No   Sexual activity: Not on file  Other Topics Concern   Not on file  Social History Narrative   Not on file   Social Determinants of Health   Financial Resource Strain: Not on file  Food Insecurity: Not on file  Transportation Needs: Not on file  Physical Activity: Not  on file  Stress: Not on file  Social Connections: Not on file     Family History: The patient's family history is negative for Breast cancer.  ROS:   Please see the history of present illness.    (+) Palpitations All other systems reviewed and are negative.  EKGs/Labs/Other Studies Reviewed:    The following studies were reviewed today:  No prior cardiovascular studies available.   EKG:  EKG is personally reviewed and interpreted. 05/20/2021: Sinus rhythm. Rate 70 bpm.  Recent Labs: No results found for requested labs within last 8760 hours.   Recent Lipid Panel No results found for: CHOL, TRIG, HDL, CHOLHDL, VLDL, LDLCALC, LDLDIRECT   Risk Assessment/Calculations:          Physical Exam:    VS:  BP 120/80 (BP Location: Left Arm, Patient Position: Sitting, Cuff Size: Normal)    Pulse 70    Ht 5' 7.5" (1.715 m)    Wt 161 lb (73 kg)    SpO2 94%    BMI 24.84 kg/m     Wt Readings from Last 3 Encounters:  05/20/21 161 lb (73 kg)  02/04/19 183 lb 9.6 oz (83.3 kg)  10/23/18 201 lb (91.2 kg)     GEN: Well nourished, well developed in no acute distress HEENT: Normal NECK: No JVD; No carotid bruits LYMPHATICS: No lymphadenopathy CARDIAC: RRR, no murmurs, rubs, gallops RESPIRATORY:  Clear to auscultation without rales, wheezing or rhonchi  ABDOMEN: Soft, non-tender, non-distended MUSCULOSKELETAL:  No edema; No deformity  SKIN: Warm and dry NEUROLOGIC:  Alert and oriented x 3 PSYCHIATRIC:  Normal affect   ASSESSMENT:    1. Nonspecific abnormal electrocardiogram (ECG) (EKG)   2. Palpitations   3. Essential hypertension    PLAN:    In order of problems listed above:  Palpitations Most likely PVCs or PACs.  We will check a Zio patch monitor to ensure that she does not have any adverse arrhythmias.  She has worn a heart monitor several years ago and she remembers it being benign.  She has performed conservative treatment strategies such as caffeine reduction  avoiding sodas, diet, exercise.  Excellent.  Palpitations seem to come and go.  There are no high risk associated symptoms such as syncope chest pain or shortness of breath.  If PVCs or PACs are the culprit, conservative treatment strategy can be pursued.  If she would like we can always consider metoprolol or diltiazem however at this time she would rather conservative management.  Excellent.  Mixed hyperlipidemia Continue with Crestor 20 mg a day.  High intensity statin dose.  Her last LDL was 88.  Liver function normal on labs.  TSH 1.9.  Excellent  Essential hypertension Continue with current medical management with lisinopril 10 mg every evening.  Nonspecific abnormal electrocardiogram (ECG) (EKG) We will check an echocardiogram to ensure proper structure and function of her heart.       Follow-up: TBD based on testing.  Medication Adjustments/Labs and Tests Ordered: Current medicines are reviewed at length with the patient today.  Concerns regarding medicines are outlined above.   Orders Placed This Encounter  Procedures   CT CARDIAC SCORING (SELF PAY ONLY)   LONG TERM MONITOR (3-14 DAYS)   EKG 12-Lead   ECHOCARDIOGRAM COMPLETE   No orders of the defined types were placed in this encounter.  Patient Instructions  Medication Instructions:  The current medical regimen is effective;  continue present plan and medications.  *If you need a refill on your cardiac medications before your next appointment, please call your pharmacy*  Lab Work: None today If you have labs (blood work) drawn today and your tests are completely normal, you will receive your results only by: [place] (if you have MyChart) OR A paper copy in the mail If you have any lab test that is abnormal or we need to change your treatment, we will call you to review the results.  Testing/Procedures: Your physician has requested that you have an echocardiogram. Echocardiography is a painless test that  uses sound waves to create images of your heart. It provides your doctor with information about the size and shape of your heart and how well your hearts chambers and valves are working. This procedure takes approximately one hour. There are no restrictions for this procedure.  Your physician has requested that you have a Coronary Calcium score which is completed by CT. Cardiac computed tomography (CT) is a painless test that uses an x-ray machine to take clear, detailed pictures of your heart. There are no instructions for this testing.  You may eat/drink and take your normal medications this day.  The cost of the testing is $99 due at the time of your appointment.  ZIO XT- Long Term Monitor Instructions  Your physician has requested you wear a ZIO patch monitor for 14 days.  This is a single patch monitor. [employer] supplies one patch monitor per enrollment. Additional stickers are not available. Please do not apply patch if you will be having a Nuclear Stress Test,  Echocardiogram, Cardiac CT, MRI, or Chest Xray during the period you would be wearing the  monitor. The patch cannot be worn during these tests. You cannot remove and re-apply the  ZIO XT patch monitor.  Your ZIO patch monitor will be mailed 3 day USPS to your address on file. It may take 3-5 days  to receive your monitor after you have been enrolled.  Once you have received your monitor, please review the enclosed instructions. Your monitor  has already been registered assigning a specific monitor serial # to you.  Billing and Patient Assistance Program Information  We have supplied [employer] with any of your insurance information on file for billing purposes. [employer] offers a sliding scale Patient Assistance Program for patients that do not have  insurance, or whose insurance does not completely cover the cost of the ZIO monitor.  You must apply for the Patient Assistance Program to qualify for this discounted rate.  To apply,  please call [employer] at [phone], select option 4, select option 2, ask to apply for  Patient Assistance Program. Theodore Demark will ask your household income, and how many people  are in your household. They will quote your out-of-pocket cost based on that information.  [employer] will also be able to set up a 4-month, interest-free payment plan if needed.  Applying the monitor   Shave hair from upper left chest.  Hold abrader disc by orange tab. Rub abrader in 40 strokes over the upper left chest as  indicated in your monitor instructions.  Clean area with 4 enclosed alcohol pads. Let dry.  Apply patch as indicated in monitor instructions. Patch will be placed under collarbone on left  side of chest with arrow pointing upward.  Rub patch adhesive wings for 2 minutes. Remove white label marked "1". Remove the white  label marked "2". Rub patch adhesive wings for 2 additional minutes.  While looking in a mirror, press and release button in center of patch. A small green light will  flash 3-4 times. This will be your only indicator that the monitor has been turned on.  Do not shower for the first 24 hours. You may shower after the first 24 hours.  Press the button if you feel a symptom. You will hear a small click. Record Date, Time and  Symptom in the Patient Logbook.  When you are ready to remove the patch, follow instructions on the last 2 pages of Patient  Logbook. Stick patch monitor onto the last page of Patient Logbook.  Place Patient Logbook in the blue and white box. Use locking tab on box and tape box closed  securely. The blue and white box has prepaid postage on it. Please place it in the mailbox as  soon as possible. Your physician should have your test results approximately 7 days after the  monitor has been mailed back to [facility].  Call [facility] Customer Care at [phone] if you have questions regarding  your ZIO XT patch monitor. Call them immediately if you see  an orange light blinking on your  monitor.  If your monitor falls off in less than 4 days, contact our Monitor department at [phone].  If your monitor becomes loose or falls off after 4 days call [employer] at [phone] for  suggestions on securing your monitor   Follow-Up: At [facility], you and your health needs are our priority.  As part of our continuing mission to provide you with exceptional heart care, we have created designated Provider Care Teams.  These Care Teams include your primary Cardiologist (physician) and Advanced Practice Providers (APPs -  Physician Assistants and Nurse Practitioners) who all work together to provide you with the care you need, when you need it.  We recommend signing up for the patient portal called "MyChart".  Sign up information is provided on this After Visit Summary.  MyChart is used to connect with patients for Virtual Visits (Telemedicine).  Patients are able to view lab/test results, encounter notes, upcoming appointments, etc.  Non-urgent messages can be sent to your provider as well.   To learn more about what you can do with MyChart, go to [email or web address].    Your next appointment:   Follow up to be determined after the above testing has been completed.  Thank you for choosing [facility]!!      I,Mathew Stumpf,acting as a scribe for Donato Schultz, MD.,have documented all relevant documentation on the behalf of Donato Schultz, MD,as directed by  Donato Schultz, MD while in the presence of Donato Schultz, MD.  I, Donato Schultz, MD, have reviewed all documentation for this visit. The documentation on 05/20/21  for the exam, diagnosis, procedures, and orders are all accurate and complete.   Signed, Tracy Furbish, MD  05/20/2021 12:08 PM    [place]

## 2021-05-20 ENCOUNTER — Other Ambulatory Visit: Payer: Self-pay

## 2021-05-20 ENCOUNTER — Encounter: Payer: Self-pay | Admitting: Cardiology

## 2021-05-20 ENCOUNTER — Ambulatory Visit: Payer: BLUE CROSS/BLUE SHIELD | Admitting: Cardiology

## 2021-05-20 ENCOUNTER — Ambulatory Visit (INDEPENDENT_AMBULATORY_CARE_PROVIDER_SITE_OTHER): Payer: BLUE CROSS/BLUE SHIELD

## 2021-05-20 VITALS — BP 120/80 | HR 70 | Ht 67.5 in | Wt 161.0 lb

## 2021-05-20 DIAGNOSIS — I10 Essential (primary) hypertension: Secondary | ICD-10-CM

## 2021-05-20 DIAGNOSIS — R9431 Abnormal electrocardiogram [ECG] [EKG]: Secondary | ICD-10-CM | POA: Diagnosis not present

## 2021-05-20 DIAGNOSIS — R002 Palpitations: Secondary | ICD-10-CM

## 2021-05-20 DIAGNOSIS — E782 Mixed hyperlipidemia: Secondary | ICD-10-CM | POA: Insufficient documentation

## 2021-05-20 NOTE — Assessment & Plan Note (Signed)
Continue with Crestor 20 mg a day.  High intensity statin dose.  Her last LDL was 88.  Liver function normal on labs.  TSH 1.9.  Excellent

## 2021-05-20 NOTE — Assessment & Plan Note (Signed)
We will check an echocardiogram to ensure proper structure and function of her heart.

## 2021-05-20 NOTE — Progress Notes (Unsigned)
Enrolled for [employer] to mail a ZIO XT long term holter monitor to the patients address on file.  

## 2021-05-20 NOTE — Patient Instructions (Signed)
Medication Instructions:  The current medical regimen is effective;  continue present plan and medications.  *If you need a refill on your cardiac medications before your next appointment, please call your pharmacy*  Lab Work: None today If you have labs (blood work) drawn today and your tests are completely normal, you will receive your results only by: MyChart Message (if you have MyChart) OR A paper copy in the mail If you have any lab test that is abnormal or we need to change your treatment, we will call you to review the results.  Testing/Procedures: Your physician has requested that you have an echocardiogram. Echocardiography is a painless test that uses sound waves to create images of your heart. It provides your doctor with information about the size and shape of your heart and how well your hearts chambers and valves are working. This procedure takes approximately one hour. There are no restrictions for this procedure.  Your physician has requested that you have a Coronary Calcium score which is completed by CT. Cardiac computed tomography (CT) is a painless test that uses an x-ray machine to take clear, detailed pictures of your heart. There are no instructions for this testing.  You may eat/drink and take your normal medications this day.  The cost of the testing is $99 due at the time of your appointment.  ZIO XT- Long Term Monitor Instructions  Your physician has requested you wear a ZIO patch monitor for 14 days.  This is a single patch monitor. [employer] supplies one patch monitor per enrollment. Additional stickers are not available. Please do not apply patch if you will be having a Nuclear Stress Test,  Echocardiogram, Cardiac CT, MRI, or Chest Xray during the period you would be wearing the  monitor. The patch cannot be worn during these tests. You cannot remove and re-apply the  ZIO XT patch monitor.  Your ZIO patch monitor will be mailed 3 day USPS to your address on  file. It may take 3-5 days  to receive your monitor after you have been enrolled.  Once you have received your monitor, please review the enclosed instructions. Your monitor  has already been registered assigning a specific monitor serial # to you.  Billing and Patient Assistance Program Information  We have supplied [employer] with any of your insurance information on file for billing purposes. [employer] offers a sliding scale Patient Assistance Program for patients that do not have  insurance, or whose insurance does not completely cover the cost of the ZIO monitor.  You must apply for the Patient Assistance Program to qualify for this discounted rate.  To apply, please call [employer] at [phone], select option 4, select option 2, ask to apply for  Patient Assistance Program. Meredeth Ide will ask your household income, and how many people  are in your household. They will quote your out-of-pocket cost based on that information.  [employer] will also be able to set up a 108-month, interest-free payment plan if needed.  Applying the monitor   Shave hair from upper left chest.  Hold abrader disc by orange tab. Rub abrader in 40 strokes over the upper left chest as  indicated in your monitor instructions.  Clean area with 4 enclosed alcohol pads. Let dry.  Apply patch as indicated in monitor instructions. Patch will be placed under collarbone on left  side of chest with arrow pointing upward.  Rub patch adhesive wings for 2 minutes. Remove white label marked "1". Remove the white  label marked "2".  Rub patch adhesive wings for 2 additional minutes.  While looking in a mirror, press and release button in center of patch. A small green light will  flash 3-4 times. This will be your only indicator that the monitor has been turned on.  Do not shower for the first 24 hours. You may shower after the first 24 hours.  Press the button if you feel a symptom. You will hear a small click. Record Date, Time and   Symptom in the Patient Logbook.  When you are ready to remove the patch, follow instructions on the last 2 pages of Patient  Logbook. Stick patch monitor onto the last page of Patient Logbook.  Place Patient Logbook in the blue and white box. Use locking tab on box and tape box closed  securely. The blue and white box has prepaid postage on it. Please place it in the mailbox as  soon as possible. Your physician should have your test results approximately 7 days after the  monitor has been mailed back to [facility].  Call [facility] Customer Care at [phone] if you have questions regarding  your ZIO XT patch monitor. Call them immediately if you see an orange light blinking on your  monitor.  If your monitor falls off in less than 4 days, contact our Monitor department at [phone].  If your monitor becomes loose or falls off after 4 days call [employer] at [phone] for  suggestions on securing your monitor   Follow-Up: At [facility], you and your health needs are our priority.  As part of our continuing mission to provide you with exceptional heart care, we have created designated Provider Care Teams.  These Care Teams include your primary Cardiologist (physician) and Advanced Practice Providers (APPs -  Physician Assistants and Nurse Practitioners) who all work together to provide you with the care you need, when you need it.  We recommend signing up for the patient portal called "MyChart".  Sign up information is provided on this After Visit Summary.  MyChart is used to connect with patients for Virtual Visits (Telemedicine).  Patients are able to view lab/test results, encounter notes, upcoming appointments, etc.  Non-urgent messages can be sent to your provider as well.   To learn more about what you can do with MyChart, go to [email or web address].    Your next appointment:   Follow up to be determined after the above testing has been completed.  Thank you for  choosing [facility]!!

## 2021-05-20 NOTE — Assessment & Plan Note (Signed)
Most likely PVCs or PACs.  We will check a Zio patch monitor to ensure that she does not have any adverse arrhythmias.  She has worn a heart monitor several years ago and she remembers it being benign.  She has performed conservative treatment strategies such as caffeine reduction avoiding sodas, diet, exercise.  Excellent.  Palpitations seem to come and go.  There are no high risk associated symptoms such as syncope chest pain or shortness of breath.  If PVCs or PACs are the culprit, conservative treatment strategy can be pursued.  If she would like we can always consider metoprolol or diltiazem however at this time she would rather conservative management.  Excellent.

## 2021-05-20 NOTE — Assessment & Plan Note (Signed)
Continue with current medical management with lisinopril 10 mg every evening.

## 2021-05-25 ENCOUNTER — Other Ambulatory Visit (HOSPITAL_COMMUNITY): Payer: Self-pay

## 2021-05-25 MED ORDER — OZEMPIC (0.25 OR 0.5 MG/DOSE) 2 MG/1.5ML ~~LOC~~ SOPN
PEN_INJECTOR | SUBCUTANEOUS | 3 refills | Status: AC
  Filled 2021-05-25 – 2021-06-14 (×3): qty 1.5, 28d supply, fill #0

## 2021-05-26 ENCOUNTER — Other Ambulatory Visit (HOSPITAL_COMMUNITY): Payer: Self-pay

## 2021-06-07 ENCOUNTER — Other Ambulatory Visit: Payer: Self-pay

## 2021-06-07 ENCOUNTER — Ambulatory Visit (INDEPENDENT_AMBULATORY_CARE_PROVIDER_SITE_OTHER)
Admission: RE | Admit: 2021-06-07 | Discharge: 2021-06-07 | Disposition: A | Discharge status: Home / Self Care | Payer: Self-pay | Source: Ambulatory Visit | Attending: Cardiology | Admitting: Cardiology

## 2021-06-07 ENCOUNTER — Other Ambulatory Visit: Payer: BLUE CROSS/BLUE SHIELD

## 2021-06-07 ENCOUNTER — Other Ambulatory Visit (HOSPITAL_COMMUNITY): Payer: Self-pay

## 2021-06-07 ENCOUNTER — Ambulatory Visit (HOSPITAL_COMMUNITY): Payer: BLUE CROSS/BLUE SHIELD | Attending: Cardiology

## 2021-06-07 DIAGNOSIS — R002 Palpitations: Secondary | ICD-10-CM | POA: Insufficient documentation

## 2021-06-07 DIAGNOSIS — R9431 Abnormal electrocardiogram [ECG] [EKG]: Secondary | ICD-10-CM | POA: Diagnosis not present

## 2021-06-07 LAB — ECHOCARDIOGRAM COMPLETE
Area-P 1/2: 3.76 cm2
S' Lateral: 2.2 cm

## 2021-06-09 ENCOUNTER — Other Ambulatory Visit (HOSPITAL_COMMUNITY): Payer: Self-pay

## 2021-06-12 DIAGNOSIS — R002 Palpitations: Secondary | ICD-10-CM | POA: Diagnosis not present

## 2021-06-12 DIAGNOSIS — R9431 Abnormal electrocardiogram [ECG] [EKG]: Secondary | ICD-10-CM | POA: Diagnosis not present

## 2021-06-14 ENCOUNTER — Other Ambulatory Visit (HOSPITAL_COMMUNITY): Payer: Self-pay

## 2021-06-16 ENCOUNTER — Other Ambulatory Visit (HOSPITAL_COMMUNITY): Payer: Self-pay

## 2021-06-17 ENCOUNTER — Other Ambulatory Visit (HOSPITAL_COMMUNITY): Payer: Self-pay

## 2021-07-03 ENCOUNTER — Other Ambulatory Visit (HOSPITAL_COMMUNITY): Payer: Self-pay

## 2021-07-07 ENCOUNTER — Telehealth: Payer: Self-pay

## 2021-07-07 MED ORDER — METOPROLOL SUCCINATE ER 25 MG PO TB24: 25.0000 mg | ORAL_TABLET | Freq: Every day | ORAL | 3 refills | Status: DC

## 2021-07-07 NOTE — Telephone Encounter (Signed)
Spoke with patient to discuss monitor results. ? ?Per Dr. Anne Fu: ??           Sinus rhythm avg 74 bpm ??           Rare PVC/PAC's ??           Brief NSVT - 4 beats at 179 bpm, 11 beats at 119 (slow) ??           ECHO with normal EF ??           Recommend Toprol XL 25 mg ? ?Toprol XL 25mg  QD ordered and sent to pharmacy of choice. ? ?Patient verbalized understanding and agrees with plan above. ?

## 2021-07-07 NOTE — Telephone Encounter (Signed)
-----   Message from Jake Bathe, MD sent at 07/07/2021  7:16 AM EDT ----- ?? Sinus rhythm avg 74 bpm ?? Rare PVC/PAC's ?? Brief NSVT - 4 beats at 179 bpm, 11 beats at 119 (slow) ?? ECHO with normal EF ?? Recommend Toprol XL 25 mg ?

## 2021-10-14 ENCOUNTER — Other Ambulatory Visit: Payer: Self-pay | Admitting: Internal Medicine

## 2021-10-14 DIAGNOSIS — Z1231 Encounter for screening mammogram for malignant neoplasm of breast: Secondary | ICD-10-CM

## 2021-10-25 ENCOUNTER — Ambulatory Visit
Admission: RE | Admit: 2021-10-25 | Discharge: 2021-10-25 | Disposition: A | Discharge status: Home / Self Care | Payer: BLUE CROSS/BLUE SHIELD | Source: Ambulatory Visit | Attending: Internal Medicine | Admitting: Internal Medicine

## 2021-10-25 DIAGNOSIS — Z1231 Encounter for screening mammogram for malignant neoplasm of breast: Secondary | ICD-10-CM

## 2022-03-15 ENCOUNTER — Other Ambulatory Visit: Payer: Self-pay | Admitting: Cardiology

## 2022-04-13 ENCOUNTER — Other Ambulatory Visit: Payer: Self-pay

## 2022-04-13 MED ORDER — METOPROLOL SUCCINATE ER 25 MG PO TB24: ORAL_TABLET | ORAL | 0 refills | Status: DC

## 2022-05-16 DIAGNOSIS — M7061 Trochanteric bursitis, right hip: Secondary | ICD-10-CM | POA: Diagnosis not present

## 2022-06-07 ENCOUNTER — Ambulatory Visit: Payer: BLUE CROSS/BLUE SHIELD | Admitting: Physician Assistant

## 2022-07-15 DIAGNOSIS — I10 Essential (primary) hypertension: Secondary | ICD-10-CM | POA: Diagnosis not present

## 2022-07-15 DIAGNOSIS — F325 Major depressive disorder, single episode, in full remission: Secondary | ICD-10-CM | POA: Diagnosis not present

## 2022-07-15 DIAGNOSIS — Z6832 Body mass index (BMI) 32.0-32.9, adult: Secondary | ICD-10-CM | POA: Diagnosis not present

## 2022-07-15 DIAGNOSIS — E669 Obesity, unspecified: Secondary | ICD-10-CM | POA: Diagnosis not present

## 2022-07-15 DIAGNOSIS — E785 Hyperlipidemia, unspecified: Secondary | ICD-10-CM | POA: Diagnosis not present

## 2022-07-17 ENCOUNTER — Other Ambulatory Visit: Payer: Self-pay | Admitting: Cardiology

## 2022-08-01 ENCOUNTER — Other Ambulatory Visit: Payer: Self-pay | Admitting: Cardiology

## 2022-08-02 MED ORDER — METOPROLOL SUCCINATE ER 25 MG PO TB24: ORAL_TABLET | ORAL | 0 refills | Status: DC

## 2022-08-08 DIAGNOSIS — R6884 Jaw pain: Secondary | ICD-10-CM | POA: Diagnosis not present

## 2022-08-08 DIAGNOSIS — J029 Acute pharyngitis, unspecified: Secondary | ICD-10-CM | POA: Diagnosis not present

## 2022-08-08 DIAGNOSIS — J040 Acute laryngitis: Secondary | ICD-10-CM | POA: Diagnosis not present

## 2022-08-12 ENCOUNTER — Telehealth: Payer: Self-pay | Admitting: Cardiology

## 2022-08-12 MED ORDER — METOPROLOL SUCCINATE ER 25 MG PO TB24: ORAL_TABLET | ORAL | 1 refills | Status: DC

## 2022-08-12 NOTE — Telephone Encounter (Signed)
Pt's medication was sent to pt's pharmacy as requested. Confirmation received.  °

## 2022-08-12 NOTE — Telephone Encounter (Signed)
*  STAT* If patient is at the pharmacy, call can be transferred to refill team.   1. Which medications need to be refilled? (please list name of each medication and dose if known)   metoprolol succinate (TOPROL-XL) 25 MG 24 hr tablet    2. Which pharmacy/location (including street and city if local pharmacy) is medication to be sent to? [facility] - [place], [place] - [address] [facility] AT Portal to Registered [facility] Sites   3. Do they need a 30 day or 90 day supply? 90 day    Pt has appt scheduled for 01/12/2023

## 2022-09-13 DIAGNOSIS — J329 Chronic sinusitis, unspecified: Secondary | ICD-10-CM | POA: Diagnosis not present

## 2022-09-13 DIAGNOSIS — J40 Bronchitis, not specified as acute or chronic: Secondary | ICD-10-CM | POA: Diagnosis not present

## 2022-09-13 DIAGNOSIS — R059 Cough, unspecified: Secondary | ICD-10-CM | POA: Diagnosis not present

## 2022-09-28 DIAGNOSIS — I10 Essential (primary) hypertension: Secondary | ICD-10-CM | POA: Diagnosis not present

## 2022-09-28 DIAGNOSIS — M7070 Other bursitis of hip, unspecified hip: Secondary | ICD-10-CM | POA: Diagnosis not present

## 2022-09-28 DIAGNOSIS — G4733 Obstructive sleep apnea (adult) (pediatric): Secondary | ICD-10-CM | POA: Diagnosis not present

## 2022-09-28 DIAGNOSIS — E785 Hyperlipidemia, unspecified: Secondary | ICD-10-CM | POA: Diagnosis not present

## 2022-09-28 DIAGNOSIS — M810 Age-related osteoporosis without current pathological fracture: Secondary | ICD-10-CM | POA: Diagnosis not present

## 2022-09-28 DIAGNOSIS — R0600 Dyspnea, unspecified: Secondary | ICD-10-CM | POA: Diagnosis not present

## 2022-09-28 DIAGNOSIS — R0602 Shortness of breath: Secondary | ICD-10-CM | POA: Diagnosis not present

## 2022-09-29 ENCOUNTER — Other Ambulatory Visit: Payer: Self-pay | Admitting: Internal Medicine

## 2022-09-29 ENCOUNTER — Ambulatory Visit
Admission: RE | Admit: 2022-09-29 | Discharge: 2022-09-29 | Disposition: A | Discharge status: Home / Self Care | Payer: 59 | Source: Ambulatory Visit | Attending: Internal Medicine | Admitting: Internal Medicine

## 2022-09-29 DIAGNOSIS — R7989 Other specified abnormal findings of blood chemistry: Secondary | ICD-10-CM

## 2022-09-29 DIAGNOSIS — R0602 Shortness of breath: Secondary | ICD-10-CM

## 2022-09-29 DIAGNOSIS — I700 Atherosclerosis of aorta: Secondary | ICD-10-CM | POA: Diagnosis not present

## 2022-09-29 MED ORDER — IOPAMIDOL (ISOVUE-370) INJECTION 76%
75.0000 mL | Freq: Once | INTRAVENOUS | Status: AC | PRN
  Administered 2022-09-29: 75 mL via INTRAVENOUS

## 2022-10-04 ENCOUNTER — Other Ambulatory Visit: Payer: Self-pay | Admitting: Internal Medicine

## 2022-10-04 DIAGNOSIS — Z1231 Encounter for screening mammogram for malignant neoplasm of breast: Secondary | ICD-10-CM

## 2022-10-18 ENCOUNTER — Ambulatory Visit (HOSPITAL_BASED_OUTPATIENT_CLINIC_OR_DEPARTMENT_OTHER): Payer: 59 | Admitting: Cardiology

## 2022-10-18 ENCOUNTER — Encounter (HOSPITAL_BASED_OUTPATIENT_CLINIC_OR_DEPARTMENT_OTHER): Payer: Self-pay | Admitting: Cardiology

## 2022-10-18 VITALS — BP 128/72 | HR 85 | Ht 67.5 in | Wt 195.4 lb

## 2022-10-18 DIAGNOSIS — E782 Mixed hyperlipidemia: Secondary | ICD-10-CM | POA: Diagnosis not present

## 2022-10-18 DIAGNOSIS — R002 Palpitations: Secondary | ICD-10-CM | POA: Diagnosis not present

## 2022-10-18 DIAGNOSIS — I6523 Occlusion and stenosis of bilateral carotid arteries: Secondary | ICD-10-CM

## 2022-10-18 DIAGNOSIS — I10 Essential (primary) hypertension: Secondary | ICD-10-CM | POA: Diagnosis not present

## 2022-10-18 NOTE — Patient Instructions (Signed)
Medication Instructions:  The current medical regimen is effective;  continue present plan and medications.  *If you need a refill on your cardiac medications before your next appointment, please call your pharmacy*  Testing/Procedures: Your physician has requested that you have a carotid duplex. This test is an ultrasound of the carotid arteries in your neck. It looks at blood flow through these arteries that supply the brain with blood. Allow one hour for this exam. There are no restrictions or special instructions.  Follow-Up: At [facility], you and your health needs are our priority.  As part of our continuing mission to provide you with exceptional heart care, we have created designated Provider Care Teams.  These Care Teams include your primary Cardiologist (physician) and Advanced Practice Providers (APPs -  Physician Assistants and Nurse Practitioners) who all work together to provide you with the care you need, when you need it.  We recommend signing up for the patient portal called "MyChart".  Sign up information is provided on this After Visit Summary.  MyChart is used to connect with patients for Virtual Visits (Telemedicine).  Patients are able to view lab/test results, encounter notes, upcoming appointments, etc.  Non-urgent messages can be sent to your provider as well.   To learn more about what you can do with MyChart, go to [email or web address].    Your next appointment:   1 year(s)  Provider:   Donato Schultz, MD

## 2022-10-18 NOTE — Progress Notes (Signed)
  Cardiology Office Note:  .   Date:  10/18/2022  ID:  Tracy Atkins, DOB 06/14/1960, MRN [id number] PCP: Rodrigo Ran, MD  [facility] Providers Cardiologist:  Donato Schultz, MD    History of Present Illness: Marland Kitchen   Tracy Atkins is a 62 y.o. female here for follow-up palpitations  Has had these intermittently for years.  Randomly.  Magnesium can help.  Prior EKG heart rate 70.  PVCs and PACs on ZIO.  Was placed on metoprolol XL 25 mg a day and has been feeling really well.  No further skips or palpitations.  She has been on semaglutide compound with B12.  Just started 1 week ago.  Approved through Dr. Waynard Edwards  She also had an episode of bronchitis in [place] had chest x-ray which showed some carotid calcification.  [employer] screening confirmed this.  We will check carotid Doppler.    ROS: No strokelike symptoms no syncope no chest pain  Studies Reviewed: Marland Kitchen   EKG Interpretation Date/Time:  Tuesday October 18 2022 15:19:04 EDT Ventricular Rate:  85 PR Interval:  126 QRS Duration:  66 QT Interval:  376 QTC Calculation: 447 R Axis:   40  Text Interpretation: Normal sinus rhythm Possible Left atrial enlargement When compared with ECG of 14-Jun-2017 14:24, No significant change was found Confirmed by Donato Schultz ([id number]) on 10/18/2022 3:44:29 PM    ZIO PVC PAC brief NSVT for 4 beats with echo normal EF recommended Toprol-XL 25  Echo normal EF 65%  Risk Assessment/Calculations:            Physical Exam:   VS:  BP 128/72 (BP Location: Left Arm, Patient Position: Sitting, Cuff Size: Large)   Pulse 85   Ht 5' 7.5" (1.715 m)   Wt 195 lb 6.4 oz (88.6 kg)   BMI 30.15 kg/m    Wt Readings from Last 3 Encounters:  10/18/22 195 lb 6.4 oz (88.6 kg)  05/20/21 161 lb (73 kg)  02/04/19 183 lb 9.6 oz (83.3 kg)    GEN: Well nourished, well developed in no acute distress NECK: No JVD; No carotid bruits CARDIAC: RRR, no murmurs, rubs, gallops RESPIRATORY:  Clear to auscultation  without rales, wheezing or rhonchi  ABDOMEN: Soft, non-tender, non-distended EXTREMITIES:  No edema; No deformity   ASSESSMENT AND PLAN: .    Palpitations/PVCs/PACs - Doing much better with metoprolol low-dose 25 mg once a day XL.  No changes made.  Continue.  Carotid artery disease - Previously plaque seen on chest x-ray.  We will go ahead and check carotid duplex for further analysis. - Consider aspirin 81 mg.  Continue with Crestor.  Hyperlipidemia - Currently on Crestor 20 mg a day.  LDL 2023 was 94.  Dr. Waynard Edwards has been watching.  Weight loss - Was on Ozempic for 3 years.  Lost 50 pounds.  Gained it back after insurance stopped paying for it.  She is now taking a semaglutide/B12 compound.        Dispo: 1 year  Signed, Donato Schultz, MD

## 2022-10-27 ENCOUNTER — Ambulatory Visit
Admission: RE | Admit: 2022-10-27 | Discharge: 2022-10-27 | Disposition: A | Discharge status: Home / Self Care | Payer: 59 | Source: Ambulatory Visit | Attending: Internal Medicine | Admitting: Internal Medicine

## 2022-10-27 DIAGNOSIS — Z1231 Encounter for screening mammogram for malignant neoplasm of breast: Secondary | ICD-10-CM

## 2022-11-17 ENCOUNTER — Ambulatory Visit (INDEPENDENT_AMBULATORY_CARE_PROVIDER_SITE_OTHER): Payer: 59

## 2022-11-17 DIAGNOSIS — E785 Hyperlipidemia, unspecified: Secondary | ICD-10-CM | POA: Diagnosis not present

## 2022-11-17 DIAGNOSIS — M810 Age-related osteoporosis without current pathological fracture: Secondary | ICD-10-CM | POA: Diagnosis not present

## 2022-11-17 DIAGNOSIS — I6523 Occlusion and stenosis of bilateral carotid arteries: Secondary | ICD-10-CM

## 2022-11-17 DIAGNOSIS — R7301 Impaired fasting glucose: Secondary | ICD-10-CM | POA: Diagnosis not present

## 2022-11-17 DIAGNOSIS — Z1212 Encounter for screening for malignant neoplasm of rectum: Secondary | ICD-10-CM | POA: Diagnosis not present

## 2022-11-17 DIAGNOSIS — I10 Essential (primary) hypertension: Secondary | ICD-10-CM | POA: Diagnosis not present

## 2022-11-17 DIAGNOSIS — Z0000 Encounter for general adult medical examination without abnormal findings: Secondary | ICD-10-CM | POA: Diagnosis not present

## 2022-11-17 DIAGNOSIS — K219 Gastro-esophageal reflux disease without esophagitis: Secondary | ICD-10-CM | POA: Diagnosis not present

## 2022-11-17 DIAGNOSIS — M7070 Other bursitis of hip, unspecified hip: Secondary | ICD-10-CM | POA: Diagnosis not present

## 2022-11-23 DIAGNOSIS — R7301 Impaired fasting glucose: Secondary | ICD-10-CM | POA: Diagnosis not present

## 2022-11-23 DIAGNOSIS — G4733 Obstructive sleep apnea (adult) (pediatric): Secondary | ICD-10-CM | POA: Diagnosis not present

## 2022-11-23 DIAGNOSIS — I700 Atherosclerosis of aorta: Secondary | ICD-10-CM | POA: Diagnosis not present

## 2022-11-23 DIAGNOSIS — R82998 Other abnormal findings in urine: Secondary | ICD-10-CM | POA: Diagnosis not present

## 2022-11-23 DIAGNOSIS — I10 Essential (primary) hypertension: Secondary | ICD-10-CM | POA: Diagnosis not present

## 2022-11-23 DIAGNOSIS — E669 Obesity, unspecified: Secondary | ICD-10-CM | POA: Diagnosis not present

## 2022-11-23 DIAGNOSIS — E785 Hyperlipidemia, unspecified: Secondary | ICD-10-CM | POA: Diagnosis not present

## 2022-11-23 DIAGNOSIS — Z23 Encounter for immunization: Secondary | ICD-10-CM | POA: Diagnosis not present

## 2022-11-23 DIAGNOSIS — F329 Major depressive disorder, single episode, unspecified: Secondary | ICD-10-CM | POA: Diagnosis not present

## 2022-11-23 DIAGNOSIS — Z0000 Encounter for general adult medical examination without abnormal findings: Secondary | ICD-10-CM | POA: Diagnosis not present

## 2022-11-23 DIAGNOSIS — J45909 Unspecified asthma, uncomplicated: Secondary | ICD-10-CM | POA: Diagnosis not present

## 2022-11-23 DIAGNOSIS — M7070 Other bursitis of hip, unspecified hip: Secondary | ICD-10-CM | POA: Diagnosis not present

## 2022-12-02 DIAGNOSIS — M7061 Trochanteric bursitis, right hip: Secondary | ICD-10-CM | POA: Diagnosis not present

## 2023-01-12 ENCOUNTER — Ambulatory Visit: Payer: Medicare Other | Admitting: Cardiology

## 2023-03-27 DIAGNOSIS — H353131 Nonexudative age-related macular degeneration, bilateral, early dry stage: Secondary | ICD-10-CM | POA: Diagnosis not present

## 2023-03-27 DIAGNOSIS — H2513 Age-related nuclear cataract, bilateral: Secondary | ICD-10-CM | POA: Diagnosis not present

## 2023-03-28 DIAGNOSIS — I10 Essential (primary) hypertension: Secondary | ICD-10-CM | POA: Diagnosis not present

## 2023-03-28 DIAGNOSIS — E785 Hyperlipidemia, unspecified: Secondary | ICD-10-CM | POA: Diagnosis not present

## 2023-03-31 ENCOUNTER — Other Ambulatory Visit: Payer: Self-pay | Admitting: Cardiology

## 2023-06-12 ENCOUNTER — Encounter (HOSPITAL_BASED_OUTPATIENT_CLINIC_OR_DEPARTMENT_OTHER): Payer: Self-pay | Admitting: Cardiology

## 2023-06-12 DIAGNOSIS — I6523 Occlusion and stenosis of bilateral carotid arteries: Secondary | ICD-10-CM

## 2023-06-12 DIAGNOSIS — E782 Mixed hyperlipidemia: Secondary | ICD-10-CM

## 2023-06-14 ENCOUNTER — Encounter (HOSPITAL_BASED_OUTPATIENT_CLINIC_OR_DEPARTMENT_OTHER): Payer: Self-pay

## 2023-07-24 ENCOUNTER — Ambulatory Visit (HOSPITAL_BASED_OUTPATIENT_CLINIC_OR_DEPARTMENT_OTHER)
Admission: RE | Admit: 2023-07-24 | Discharge: 2023-07-24 | Disposition: A | Discharge status: Home / Self Care | Payer: Self-pay | Source: Ambulatory Visit | Attending: Cardiology | Admitting: Cardiology

## 2023-07-24 DIAGNOSIS — I6523 Occlusion and stenosis of bilateral carotid arteries: Secondary | ICD-10-CM | POA: Insufficient documentation

## 2023-07-24 DIAGNOSIS — E782 Mixed hyperlipidemia: Secondary | ICD-10-CM | POA: Insufficient documentation

## 2023-07-25 ENCOUNTER — Encounter (HOSPITAL_BASED_OUTPATIENT_CLINIC_OR_DEPARTMENT_OTHER): Payer: Self-pay | Admitting: Cardiology

## 2023-10-27 ENCOUNTER — Other Ambulatory Visit: Payer: Self-pay | Admitting: Internal Medicine

## 2023-10-27 DIAGNOSIS — Z1231 Encounter for screening mammogram for malignant neoplasm of breast: Secondary | ICD-10-CM

## 2023-10-29 ENCOUNTER — Other Ambulatory Visit: Payer: Self-pay | Admitting: Cardiology

## 2023-11-06 ENCOUNTER — Telehealth: Payer: Self-pay | Admitting: Cardiology

## 2023-11-06 ENCOUNTER — Other Ambulatory Visit: Payer: Self-pay

## 2023-11-06 MED ORDER — METOPROLOL SUCCINATE ER 25 MG PO TB24
25.0000 mg | ORAL_TABLET | Freq: Every day | ORAL | 0 refills | Status: DC
Start: 2023-11-06 — End: 2024-01-31

## 2023-11-06 NOTE — Telephone Encounter (Signed)
*  STAT* If patient is at the pharmacy, call can be transferred to refill team.   1. Which medications need to be refilled? (please list name of each medication and dose if known) metoprolol  succinate (TOPROL -XL) 25 MG 24 hr tablet   2. Which pharmacy/location (including street and city if local pharmacy) is medication to be sent to? [facility] - [place], [place] - [address] AT Portal to Registered [facility] Sites   3. Do they need a 30 day or 90 day supply? 90  Patient has appt on 02/06/24

## 2023-11-06 NOTE — Telephone Encounter (Signed)
 RX sent to requested Pharmacy

## 2023-11-14 ENCOUNTER — Ambulatory Visit
Admission: RE | Admit: 2023-11-14 | Discharge: 2023-11-14 | Disposition: A | Discharge status: Home / Self Care | Source: Ambulatory Visit | Attending: Internal Medicine | Admitting: Internal Medicine

## 2023-11-14 DIAGNOSIS — Z1231 Encounter for screening mammogram for malignant neoplasm of breast: Secondary | ICD-10-CM | POA: Diagnosis not present

## 2024-01-29 ENCOUNTER — Telehealth: Payer: Self-pay | Admitting: Cardiology

## 2024-01-29 MED ORDER — MAGNESIUM 250 MG PO TABS: 250.0000 mg | ORAL_TABLET | Freq: Every day | ORAL | 0 refills | Status: AC

## 2024-01-29 NOTE — Telephone Encounter (Signed)
*  STAT* If patient is at the pharmacy, call can be transferred to refill team.   1. Which medications need to be refilled? (please list name of each medication and dose if known) metoprolol  succinate (TOPROL -XL) 25 MG 24 hr tablet  2. Would you like to learn more about the convenience, safety, & potential cost savings by using the [facility]? NO   3. Are you open to using the [facility] NO   4. Which pharmacy/location (including street and city if local pharmacy) is medication to be sent to?[id number] [place], [place] - [address]    5. Do they need a 30 day or 90 day supply? 30   Patient needs to pick up medication because she is going to run out before her next visit on 10/28

## 2024-01-29 NOTE — Telephone Encounter (Signed)
 RX sent in

## 2024-01-31 ENCOUNTER — Telehealth: Payer: Self-pay | Admitting: Cardiology

## 2024-01-31 MED ORDER — METOPROLOL SUCCINATE ER 25 MG PO TB24
25.0000 mg | ORAL_TABLET | Freq: Every day | ORAL | 0 refills | Status: AC
Start: 2024-01-31 — End: ?

## 2024-01-31 NOTE — Telephone Encounter (Signed)
 Pt's medication was sent to pt's pharmacy as requested. Confirmation received.

## 2024-01-31 NOTE — Telephone Encounter (Signed)
*  STAT* If patient is at the pharmacy, call can be transferred to refill team.   1. Which medications need to be refilled? (please list name of each medication and dose if known)   metoprolol  succinate (TOPROL -XL) 25 MG 24 hr tablet   2. Would you like to learn more about the convenience, safety, & potential cost savings by using the [facility]?   3. Are you open to using the [facility] (Type [facility]. ).  4. Which pharmacy/location (including street and city if local pharmacy) is medication to be sent to?  [id number] - [place], [place] - [address]   5. Do they need a 30 day or 90 day supply?   Patient stated she has 3 tablets left.  Patient has appointment scheduled with Dr. Jeffrie on 10/28.

## 2024-02-05 DIAGNOSIS — H353131 Nonexudative age-related macular degeneration, bilateral, early dry stage: Secondary | ICD-10-CM | POA: Diagnosis not present

## 2024-02-06 ENCOUNTER — Ambulatory Visit: Attending: Cardiology | Admitting: Cardiology

## 2024-02-06 ENCOUNTER — Encounter: Payer: Self-pay | Admitting: Cardiology

## 2024-02-06 VITALS — BP 130/78 | HR 75 | Ht 67.5 in | Wt 170.0 lb

## 2024-02-06 DIAGNOSIS — I10 Essential (primary) hypertension: Secondary | ICD-10-CM | POA: Diagnosis not present

## 2024-02-06 DIAGNOSIS — E782 Mixed hyperlipidemia: Secondary | ICD-10-CM | POA: Diagnosis not present

## 2024-02-06 DIAGNOSIS — I6523 Occlusion and stenosis of bilateral carotid arteries: Secondary | ICD-10-CM

## 2024-02-06 MED ORDER — EZETIMIBE 10 MG PO TABS: 10.0000 mg | ORAL_TABLET | Freq: Every day | ORAL | 3 refills | Status: AC

## 2024-02-06 MED ORDER — ASPIRIN 81 MG PO TBEC
81.0000 mg | DELAYED_RELEASE_TABLET | Freq: Every day | ORAL | Status: AC
Start: 2024-02-06 — End: ?

## 2024-02-06 NOTE — Patient Instructions (Signed)
 Medication Instructions:  Please start ASA 81 mg a day. Continue all other medications as listed.  *If you need a refill on your cardiac medications before your next appointment, please call your pharmacy*  Follow-Up: At [facility], you and your health needs are our priority.  As part of our continuing mission to provide you with exceptional heart care, our providers are all part of one team.  This team includes your primary Cardiologist (physician) and Advanced Practice Providers or APPs (Physician Assistants and Nurse Practitioners) who all work together to provide you with the care you need, when you need it.  Your next appointment:   Follow up as needed with Dr Jeffrie   We recommend signing up for the patient portal called MyChart.  Sign up information is provided on this After Visit Summary.  MyChart is used to connect with patients for Virtual Visits (Telemedicine).  Patients are able to view lab/test results, encounter notes, upcoming appointments, etc.  Non-urgent messages can be sent to your provider as well.   To learn more about what you can do with MyChart, go to [email or web address].

## 2024-02-06 NOTE — Progress Notes (Signed)
 Cardiology Office Note:  .   Date:  02/06/2024  ID:  Tracy Atkins, DOB 1960-12-17, MRN [id number] PCP: Shayne Anes, MD  [facility] Providers Cardiologist:  Anes Parchment, MD    History of Present Illness: Tracy Atkins   Tracy Atkins is a 63 y.o. female Discussed the use of AI scribe software History of Present Illness Tracy Atkins is a 63 year old female with palpitations who presents for follow-up.  She has experienced intermittent palpitations over the past several years, occurring randomly. Magnesium  sometimes helps alleviate them. She was placed on Toprol  XL 25 mg daily, which has improved her symptoms. An EKG showed no abnormalities, and a Zio patch previously revealed PVCs and PACs with a normal ejection fraction and no atrial fibrillation.  She has hyperlipidemia and is taking Crestor  20 mg. A [employer] screening noted carotid calcification, and a prior scan showed aortic atherosclerosis. Her LDL was 95 in December 2024, and she is also taking Zetia 10 mg to lower her LDL to less than 70.  She is on lisinopril 10 mg for blood pressure management. Her weight is 170 pounds, and she is working on [employer] with semaglutide . Her last creatinine level was 0.7, indicating good kidney function. A calcium  score from July 24, 2023, was 8, placing her in the 1st percentile.      ROS: No chest pain, no shortness of breath, no recent palpitations.  Doing well.  Studies Reviewed: Tracy Atkins   EKG Interpretation Date/Time:  Tuesday February 06 2024 09:42:34 EDT Ventricular Rate:  75 PR Interval:  144 QRS Duration:  72 QT Interval:  386 QTC Calculation: 431 R Axis:   76  Text Interpretation: Normal sinus rhythm Normal ECG When compared with ECG of 18-Oct-2022 15:19, No significant change was found Confirmed by Parchment Anes ([id number]) on 02/06/2024 9:54:35 AM    Results LABS Creatinine: 0.7  RADIOLOGY Calcium  Score: Score 8, 61st percentile, very little plaque  (07/24/2023)  DIAGNOSTIC Echocardiogram: Normal ejection fraction (EF) 65% Zio Patch: Premature ventricular contractions (PVCs) and premature atrial contractions (PACs) with normal EF, no atrial fibrillation EKG: Normal Risk Assessment/Calculations:            Physical Exam:   VS:  BP 130/78   Pulse 75   Ht 5' 7.5 (1.715 m)   Wt 170 lb (77.1 kg)   SpO2 97%   BMI 26.23 kg/m    Wt Readings from Last 3 Encounters:  02/06/24 170 lb (77.1 kg)  10/18/22 195 lb 6.4 oz (88.6 kg)  05/20/21 161 lb (73 kg)    GEN: Well nourished, well developed in no acute distress NECK: No JVD; No carotid bruits CARDIAC: RRR, no murmurs, no rubs, no gallops RESPIRATORY:  Clear to auscultation without rales, wheezing or rhonchi  ABDOMEN: Soft, non-tender, non-distended EXTREMITIES:  No edema; No deformity   ASSESSMENT AND PLAN: .    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 EKG is normal, and Zio patch showed PVCs and PACs with normal EF and no atrial fibrillation. - Continue Metoprolol  XL 25 mg daily - Continue magnesium  supplementation as needed - Request Dr. 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 Calcium  score is low at 8, indicating minimal plaque in LAD. Discussed low-dose aspirin  for plaque  management, considering low bleeding risk and protective effect against cardiovascular events. - Initiate low-dose aspirin  81 mg daily with breakfast to minimize stomach irritation  Mixed Hyperlipidemia Managed with Crestor  20 mg daily. LDL was 95 in December 2024, with a goal to reduce LDL to less than 70 due to plaque presence. Ezetimibe was added to help achieve this goal. - Continue Crestor  20  mg daily - Continue Ezetimibe 10 mg daily - Request Dr. Shayne to refill Ezetimibe and Crestor   Essential Hypertension Lisinopril 10 mg daily. - Continue Lisinopril 10 mg daily         Dispo: Graduate from cardiology clinic.  Please let us  know if we can be of further assistance.  Please refill Toprol , Crestor , Zetia.  Thank you.  Signed, Oneil Parchment, MD

## 2024-02-15 DIAGNOSIS — Z23 Encounter for immunization: Secondary | ICD-10-CM | POA: Diagnosis not present

## 2024-02-15 DIAGNOSIS — F329 Major depressive disorder, single episode, unspecified: Secondary | ICD-10-CM | POA: Diagnosis not present

## 2024-02-15 DIAGNOSIS — I10 Essential (primary) hypertension: Secondary | ICD-10-CM | POA: Diagnosis not present

## 2024-02-15 DIAGNOSIS — Z0000 Encounter for general adult medical examination without abnormal findings: Secondary | ICD-10-CM | POA: Diagnosis not present

## 2024-02-15 DIAGNOSIS — E785 Hyperlipidemia, unspecified: Secondary | ICD-10-CM | POA: Diagnosis not present

## 2024-02-15 DIAGNOSIS — I700 Atherosclerosis of aorta: Secondary | ICD-10-CM | POA: Diagnosis not present

## 2024-02-15 DIAGNOSIS — J45909 Unspecified asthma, uncomplicated: Secondary | ICD-10-CM | POA: Diagnosis not present

## 2024-02-15 DIAGNOSIS — R7301 Impaired fasting glucose: Secondary | ICD-10-CM | POA: Diagnosis not present

## 2024-02-15 DIAGNOSIS — R809 Proteinuria, unspecified: Secondary | ICD-10-CM | POA: Diagnosis not present

## 2024-02-15 DIAGNOSIS — E669 Obesity, unspecified: Secondary | ICD-10-CM | POA: Diagnosis not present

## 2024-02-15 DIAGNOSIS — G4733 Obstructive sleep apnea (adult) (pediatric): Secondary | ICD-10-CM | POA: Diagnosis not present

## 2024-02-15 DIAGNOSIS — M810 Age-related osteoporosis without current pathological fracture: Secondary | ICD-10-CM | POA: Diagnosis not present

## 2024-02-15 DIAGNOSIS — R82998 Other abnormal findings in urine: Secondary | ICD-10-CM | POA: Diagnosis not present
